# Patient Record
Sex: Male | Born: 1985 | Race: White | Hispanic: No | Marital: Married | Smoking: Former smoker
Health system: Southern US, Community
[De-identification: ages and names within clinical notes are randomized; demographics above are authoritative.]

## PROBLEM LIST (undated history)

## (undated) DIAGNOSIS — F411 Generalized anxiety disorder: Secondary | ICD-10-CM

## (undated) DIAGNOSIS — B019 Varicella without complication: Secondary | ICD-10-CM

## (undated) HISTORY — DX: Varicella without complication: B01.9

---

## 2008-02-20 ENCOUNTER — Ambulatory Visit (HOSPITAL_BASED_OUTPATIENT_CLINIC_OR_DEPARTMENT_OTHER): Admission: RE | Admit: 2008-02-20 | Discharge: 2008-02-20 | Payer: Self-pay | Admitting: Family Medicine

## 2008-02-20 ENCOUNTER — Ambulatory Visit: Payer: Self-pay | Admitting: Diagnostic Radiology

## 2011-07-27 ENCOUNTER — Emergency Department (HOSPITAL_COMMUNITY)
Admission: EM | Admit: 2011-07-27 | Discharge: 2011-07-27 | Disposition: A | Discharge status: Home / Self Care | Payer: BC Managed Care – PPO | Attending: Emergency Medicine | Admitting: Emergency Medicine

## 2011-07-27 ENCOUNTER — Encounter (HOSPITAL_COMMUNITY): Payer: Self-pay

## 2011-07-27 ENCOUNTER — Emergency Department (HOSPITAL_COMMUNITY): Payer: BC Managed Care – PPO

## 2011-07-27 DIAGNOSIS — R079 Chest pain, unspecified: Secondary | ICD-10-CM

## 2011-07-27 DIAGNOSIS — F17200 Nicotine dependence, unspecified, uncomplicated: Secondary | ICD-10-CM | POA: Insufficient documentation

## 2011-07-27 DIAGNOSIS — Z8249 Family history of ischemic heart disease and other diseases of the circulatory system: Secondary | ICD-10-CM | POA: Insufficient documentation

## 2011-07-27 LAB — BASIC METABOLIC PANEL
GFR calc non Af Amer: >90 mL/min (ref >90)
Glucose, Bld: 90 mg/dL (ref 70–99)
Potassium: 3.7 mEq/L (ref 3.5–5.1)
Sodium: 140 mEq/L (ref 135–145)

## 2011-07-27 LAB — CBC
Hemoglobin: 15.8 g/dL (ref 13.0–17.0)
MCHC: 36.3 g/dL — ABNORMAL HIGH (ref 30.0–36.0)
RBC: 5 MIL/uL (ref 4.22–5.81)

## 2011-07-27 NOTE — ED Provider Notes (Signed)
Medical screening examination/treatment/procedure(s) were conducted as a shared visit with non-physician practitioner(s) and myself.  I personally evaluated the patient during the encounter 26 yo man with several week Hx of left sided chest pain felt in the left anterior axillary line, worse in the past few days with tingling in left arm.  His exam shows him to appear robustly healthy, clear lungs, heart sounds normal. EKG and lab workup negative.  Called [facility] to obtain an echocardiogram on him.   Carleene Cooper III, MD 07/27/11 [phone]

## 2011-07-27 NOTE — ED Notes (Signed)
Patient transported to X-ray 

## 2011-07-27 NOTE — Discharge Instructions (Signed)
Chest Pain (Nonspecific) It is often hard to give a specific diagnosis for the cause of chest pain. There is always a chance that your pain could be related to something serious, such as a heart attack or a blood clot in the lungs. You need to follow up with your caregiver for further evaluation. CAUSES   Heartburn.   Pneumonia or bronchitis.   Anxiety or stress.   Inflammation around your heart (pericarditis) or lung (pleuritis or pleurisy).   A blood clot in the lung.   A collapsed lung (pneumothorax). It can develop suddenly on its own (spontaneous pneumothorax) or from injury (trauma) to the chest.   Shingles infection (herpes zoster virus).  The chest wall is composed of bones, muscles, and cartilage. Any of these can be the source of the pain.  The bones can be bruised by injury.   The muscles or cartilage can be strained by coughing or overwork.   The cartilage can be affected by inflammation and become sore (costochondritis).  DIAGNOSIS  Lab tests or other studies, such as X-rays, electrocardiography, stress testing, or cardiac imaging, may be needed to find the cause of your pain.  TREATMENT   Treatment depends on what may be causing your chest pain. Treatment may include:   Acid blockers for heartburn.   Anti-inflammatory medicine.   Pain medicine for inflammatory conditions.   Antibiotics if an infection is present.   You may be advised to change lifestyle habits. This includes stopping smoking and avoiding alcohol, caffeine, and chocolate.   You may be advised to keep your head raised (elevated) when sleeping. This reduces the chance of acid going backward from your stomach into your esophagus.   Most of the time, nonspecific chest pain will improve within 2 to 3 days with rest and mild pain medicine.  HOME CARE INSTRUCTIONS   If antibiotics were prescribed, take your antibiotics as directed. Finish them even if you start to feel better.   For the next few  days, avoid physical activities that bring on chest pain. Continue physical activities as directed.   Do not smoke.   Avoid drinking alcohol.   Only take over-the-counter or prescription medicine for pain, discomfort, or fever as directed by your caregiver.   Follow your caregiver's suggestions for further testing if your chest pain does not go away.   Keep any follow-up appointments you made. If you do not go to an appointment, you could develop lasting (chronic) problems with pain. If there is any problem keeping an appointment, you must call to reschedule.  SEEK MEDICAL CARE IF:   You think you are having problems from the medicine you are taking. Read your medicine instructions carefully.   Your chest pain does not go away, even after treatment.   You develop a rash with blisters on your chest.  SEEK IMMEDIATE MEDICAL CARE IF:   You have increased chest pain or pain that spreads to your arm, neck, jaw, back, or abdomen.   You develop shortness of breath, an increasing cough, or you are coughing up blood.   You have severe back or abdominal pain, feel nauseous, or vomit.   You develop severe weakness, fainting, or chills.   You have a fever.  THIS IS AN EMERGENCY. Do not wait to see if the pain will go away. Get medical help at once. Call your local emergency services (911 in [place] Do not drive yourself to the hospital. MAKE SURE YOU:   Understand these instructions.     Will watch your condition.   Will get help right away if you are not doing well or get worse.  Document Released: 10/29/2004 Document Revised: 01/08/2011 Document Reviewed: 08/25/2007 Exit[employer] Patient Information 2012 [employer]. 

## 2011-07-27 NOTE — ED Provider Notes (Signed)
History     CSN: [id number]  Arrival date & time 07/27/11  1348   First MD Initiated Contact with Patient 07/27/11 1408      Chief Complaint  Patient presents with  . Chest Pain    (Consider location/radiation/quality/duration/timing/severity/associated sxs/prior treatment) The history is provided by the patient.  Pt is a healthy 26 y/o M who presents to ED with c/c of gradually worsening left-sided CP over the last week. Rad to left arm with assoc tingling for the last 4 days. Pain intermittent, assoc with nausea and diaphoresis, no SOB. No recent illness, no fever. Sx worse with running, though only slightly. No alleviating factors. Took ASA PTA. RF include smoking and early FH of CAD in grandfather at 60 y/o. + FH DM, HLD, HTN. No prior cardiac evaluation. No personal or known family hx PE or DVT, no recent prolonged travel or LE swelling/pain.   History reviewed. No pertinent past medical history.  History reviewed. No pertinent past surgical history.  No family history on file.  History  Substance Use Topics  . Smoking status: Current Everyday Smoker  . Smokeless tobacco: Not on file  . Alcohol Use: No      Review of Systems 10 systems reviewed and are negative for acute change except as noted in the HPI.  Allergies  Review of patient's allergies indicates no known allergies.  Home Medications  No current outpatient prescriptions on file.  BP 133/70  Pulse 84  Temp 98.4 F (36.9 C) (Oral)  Resp 12  Ht 5\' 10"  (1.778 m)  Wt 170 lb (77.111 kg)  BMI 24.39 kg/m2  SpO2 98%  Physical Exam  Nursing note reviewed. Constitutional: He appears well-developed and well-nourished. No distress.       Vital signs are reviewed and are normal.   HENT:  Head: Normocephalic and atraumatic.  Right Ear: External ear normal.  Left Ear: External ear normal.       MMM  Eyes: Conjunctivae are normal. Pupils are equal, round, and reactive to light.  Neck: Neck supple.       No bruit heard  Cardiovascular: Normal rate, regular rhythm and normal heart sounds.  Exam reveals no friction rub.   No murmur heard.      Bilateral radial and DP pulses are 2+   Pulmonary/Chest: Effort normal and breath sounds normal. No respiratory distress. He has no wheezes. He exhibits no tenderness.  Abdominal: Soft. Bowel sounds are normal. He exhibits no distension. There is no tenderness.  Musculoskeletal: He exhibits no edema.       Calves supple and non-tender  Neurological: He is alert.       Speech clear. MAEW.  Skin: Skin is warm and dry. No rash noted. He is not diaphoretic.  Psychiatric: He has a normal mood and affect.    ED Course  Procedures (including critical care time)  Labs Reviewed  CBC - Abnormal; Notable for the following:    MCHC 36.3 (*)     All other components within normal limits  BASIC METABOLIC PANEL   Dg Chest 2 View  07/27/2011  *RADIOLOGY REPORT*  Clinical Data: Chest pain.  CHEST - 2 VIEW  Comparison: None  Findings: Heart and mediastinal contours are within normal limits. No focal opacities or effusions.  No acute bony abnormality.  IMPRESSION: No active cardiopulmonary disease.  Original Report Authenticated By: Cyndie Chime, M.D.     Dx 1: Chest pain   MDM  CP (with exertional  component) in healthy young male with only personal RF smoking, family hx early CAD in grandfather. EKG unremarkable, see attending MD note for interpretation. Troponin result not showing in system, 0.00. CBC and BMP unremarkable, CXR normal. Pt pain free at return of results. Has been evaluated by EDP, will consult [facility] to plan outpatient f/u.    4:28 PM EDP has spoken with Sherian Rein, NP with [facility], who advises that they will call pt tomorrow to schedule outpatient testing. Pt agrees with plan and will be d.c home, Pain free at this time.  Shaaron Adler, [place] 07/27/11 1630

## 2011-07-27 NOTE — ED Notes (Signed)
Pt. Reports having chest pain constant pressure behind his heart for over 1 week,  He describes it as someone is pushing on the back of his heart.  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  Upon arrival to our ED all of those symptoms were resolved but he continued to have chest pressure.  He reports that nothing increases the pain but when he moves his lt. Arm it decreases the pain `.  Pt. Also has a period of anxiety when he was younger, but is not being treated for that at present time.  He also denies any injuries in the past week.

## 2011-07-27 NOTE — ED Provider Notes (Signed)
2:36 PM  Date: 07/27/2011  Rate: 74  Rhythm: normal sinus rhythm  QRS Axis: normal  Intervals: normal  ST/T Wave abnormalities: normal  Conduction Disutrbances:none  Narrative Interpretation: Normal EKG  Old EKG Reviewed: none available    Carleene Cooper III, MD 07/27/11 1439

## 2014-08-03 ENCOUNTER — Telehealth: Payer: Self-pay

## 2014-08-03 NOTE — Telephone Encounter (Signed)
Pt sent message requesting to est w/ [place]. LM on vm for pt to cb  It was not a personalized answering meassgae, so did not leave any specific info.

## 2014-08-31 ENCOUNTER — Ambulatory Visit (INDEPENDENT_AMBULATORY_CARE_PROVIDER_SITE_OTHER): Payer: BC Managed Care – PPO | Admitting: Adult Health

## 2014-08-31 ENCOUNTER — Encounter: Payer: Self-pay | Admitting: Adult Health

## 2014-08-31 VITALS — BP 110/74 | Temp 98.0°F | Ht 70.0 in | Wt 173.5 lb

## 2014-08-31 DIAGNOSIS — Z0000 Encounter for general adult medical examination without abnormal findings: Secondary | ICD-10-CM

## 2014-08-31 NOTE — Progress Notes (Signed)
HPI:  Sergio Bray is here to establish care. Very pleasant and healthy 29 year old Caucasian male with no PMH.    Last PCP and physical: "one year ago" Immunizations:Tdap in last 10 years, unsure of when it is Diet:Heart healthy diet with portion control Exercise:30 minutes of cardio a day and weight training  Has the following chronic problems that require follow up and concerns today:  He has no issues that he would like addressed today.    ROS negative for unless reported above: fevers, chills,feeling poorly, unintentional weight loss, hearing or vision loss, chest pain, palpitations, leg claudication, struggling to breath,Not feeling congested in the chest, no orthopenia, no cough,no wheezing, normal appetite, no soft tissue swelling, no hemoptysis, melena, hematochezia, hematuria, falls, loc, si, or thoughts of self harm.    Past Medical History  Diagnosis Date  . Chicken pox     History reviewed. No pertinent past surgical history.  Family History  Problem Relation Age of Onset  . Elevated Lipids Mother     History   Social History  . Marital Status: Married    Spouse Name: N/A  . Number of Children: N/A  . Years of Education: N/A   Social History Main Topics  . Smoking status: Former Research scientist (life sciences)  . Smokeless tobacco: Not on file  . Alcohol Use: 0.0 oz/week    0 Standard drinks or equivalent per week     Comment: socially  . Drug Use: No  . Sexual Activity: Not on file   Other Topics Concern  . None   Social History Narrative    No current outpatient prescriptions on file.  EXAM:  Filed Vitals:   08/31/14 0946  BP: 110/74  Temp: 98 F (36.7 C)    Body mass index is 24.89 kg/(m^2).  GENERAL: vitals reviewed and listed above, alert, oriented, appears well hydrated and in no acute distress  HEENT: atraumatic, conjunttiva clear, no obvious abnormalities on inspection of external nose and ears. Moderate cerumen impaction in bilateral ears  NECK:  Neck is soft and supple without masses, no adenopathy or thyromegaly, trachea midline, no JVD. Normal range of motion.   LUNGS: clear to auscultation bilaterally, no wheezes, rales or rhonchi, good air movement  CV: Regular rate and rhythm, normal S1/S2, no audible murmurs, gallops, or rubs. No carotid bruit and no peripheral edema.   MS: moves all extremities without noticeable abnormality. No edema noted  Abd: soft/nontender/nondistended/normal bowel sounds   Skin: warm and dry, no rash   Extremities: No clubbing, cyanosis, or edema. Capillary refill is WNL. Pulses intact bilaterally in upper and lower extremities.   Neuro: CN II-XII intact, sensation and reflexes normal throughout, 5/5 muscle strength in bilateral upper and lower extremities. Normal finger to nose. Normal rapid alternating movements. Normal romberg. No pronator drift.   PSYCH: pleasant and cooperative, no obvious depression or anxiety  ASSESSMENT AND PLAN:  1. Routine general medical examination at a health care facility - Benign exam, will use this as a physical as well.  - Basic metabolic panel; Future - CBC with Differential/Platelet; Future - Hemoglobin A1c; Future - Hepatic function panel; Future - Lipid panel; Future - POCT urinalysis dipstick; Future - TSH; Future - Continue to eat healthy and exercise.  - Follow up in one year for CPE or sooner if needed.    -We reviewed the PMH, PSH, FH, SH, Meds and Allergies. -We provided refills for any medications we will prescribe as needed. -We addressed current concerns  per orders and patient instructions. -We have asked for records for pertinent exams, studies, vaccines and notes from previous providers. -We have advised patient to follow up per instructions below.   -Patient advised to return or notify a provider immediately if symptoms worsen or persist or new concerns arise.   Dorothyann Peng, AGNP

## 2014-08-31 NOTE — Patient Instructions (Signed)
It was great meeting you today!  Continue to eat healthy and exercise.   I will follow up with you regarding your blood work.   Come back in a year for a physical or sooner with any acute issue.

## 2014-12-04 ENCOUNTER — Encounter: Payer: Self-pay | Admitting: Adult Health

## 2014-12-04 ENCOUNTER — Ambulatory Visit (INDEPENDENT_AMBULATORY_CARE_PROVIDER_SITE_OTHER): Payer: BC Managed Care – PPO | Admitting: Adult Health

## 2014-12-04 VITALS — BP 118/80 | Temp 98.4°F | Ht 70.0 in | Wt 177.7 lb

## 2014-12-04 DIAGNOSIS — R202 Paresthesia of skin: Principal | ICD-10-CM

## 2014-12-04 DIAGNOSIS — R200 Anesthesia of skin: Secondary | ICD-10-CM

## 2014-12-04 DIAGNOSIS — F411 Generalized anxiety disorder: Secondary | ICD-10-CM | POA: Diagnosis not present

## 2014-12-04 MED ORDER — CITALOPRAM HYDROBROMIDE 20 MG PO TABS: 20.0000 mg | ORAL_TABLET | Freq: Every day | ORAL | Status: DC

## 2014-12-04 MED ORDER — LORAZEPAM 0.5 MG PO TABS: 0.5000 mg | ORAL_TABLET | Freq: Three times a day (TID) | ORAL | Status: DC

## 2014-12-04 NOTE — Progress Notes (Signed)
Pre visit review using our clinic review tool, if applicable. No additional management support is needed unless otherwise documented below in the visit note. 

## 2014-12-04 NOTE — Progress Notes (Signed)
Subjective:    Patient ID: Sergio Bray, male    DOB: 1985-09-25, 29 y.o.   MRN: [id number]  HPI  29 year old male who presents to the office today for numbness and tingling in the left arm. The pain is intermittent and has been going on for a week. The discomfort also spreads to the left axilla. He believes that this is caused by his anxiety. He has had the same symptoms in the past and they were related to his anxiety. Denies any crushing chest pain, pain that radiates up his jaw. He has a lot of stress and may be changing jobs and feels anxious about that.    Has tried Zoloft and Buspar in the past and endorsed doing fairly well on them.   Review of Systems  Constitutional: Negative.   Eyes: Negative for visual disturbance.  Respiratory: Negative.   Cardiovascular: Negative.   Musculoskeletal: Negative.   Neurological: Positive for numbness.  Psychiatric/Behavioral: Negative for suicidal ideas and sleep disturbance. The patient is nervous/anxious.   All other systems reviewed and are negative.  Past Medical History  Diagnosis Date  . Chicken pox     Social History   Social History  . Marital Status: Married    Spouse Name: N/A  . Number of Children: N/A  . Years of Education: N/A   Occupational History  . Not on file.   Social History Main Topics  . Smoking status: Former Research scientist (life sciences)  . Smokeless tobacco: Not on file  . Alcohol Use: 0.0 oz/week    0 Standard drinks or equivalent per week     Comment: socially  . Drug Use: No  . Sexual Activity: Not on file   Other Topics Concern  . 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   His wife is a Pharmacist, hospital as well.     No past surgical history on file.  Family History  Problem Relation Age of Onset  . Elevated Lipids Mother   . Diabetes Paternal Grandfather   . Heart attack Maternal Grandfather     No Known Allergies  No current outpatient prescriptions on file  prior to visit.   No current facility-administered medications on file prior to visit.    BP 118/80 mmHg  Temp(Src) 98.4 F (36.9 C) (Oral)  Ht 5\' 10"  (1.778 m)  Wt 177 lb 11.2 oz (80.604 kg)  BMI 25.50 kg/m2       Objective:   Physical Exam  Constitutional: He is oriented to person, place, and time. He appears well-developed and well-nourished. No distress.  Cardiovascular: Normal rate, regular rhythm, normal heart sounds and intact distal pulses.  Exam reveals no gallop and no friction rub.   No murmur heard. Pulmonary/Chest: Effort normal and breath sounds normal. No respiratory distress. He has no wheezes. He has no rales. He exhibits no tenderness.  Neurological: He is alert and oriented to person, place, and time.  Skin: Skin is warm and dry. No rash noted. He is not diaphoretic. No erythema. No pallor.  Psychiatric: He has a normal mood and affect. His behavior is normal. Judgment and thought content normal.  Nursing note and vitals reviewed.     Assessment & Plan:  1. Numbness and tingling in left arm - Likely from anxiety - EKG 12-Lead- Sinus Bradycardia, Rate 54  2. Generalized anxiety disorder - Reviewed non pharmaceutical therapies such as weight lifting, therapy, swimming,  yoga, deep breathing exercises.  - citalopram (CELEXA) 20 MG tablet; Take 1 tablet (20 mg total) by mouth daily.  Dispense: 30 tablet; Refill: 3 - LORazepam (ATIVAN) 0.5 MG tablet; Take 1 tablet (0.5 mg total) by mouth every 8 (eight) hours.  Dispense: 20 tablet; Refill: 0- To be used only if panic attack happens.

## 2014-12-04 NOTE — Patient Instructions (Addendum)
It was great seeing you again!  I have sent in a prescription for Celexa. Please take this every morning . As discussed this medication can take 4-6 weeks to reach therapeutic effect.   Work on Sport and exercise psychologist therapies as well. Follow up with me in 2 months to see how you are doing.   If you need anything, please let me know.

## 2014-12-14 ENCOUNTER — Other Ambulatory Visit: Payer: BC Managed Care – PPO

## 2015-02-05 ENCOUNTER — Ambulatory Visit: Payer: BC Managed Care – PPO | Admitting: Adult Health

## 2015-05-21 ENCOUNTER — Encounter: Payer: Self-pay | Admitting: Adult Health

## 2015-05-31 ENCOUNTER — Ambulatory Visit (INDEPENDENT_AMBULATORY_CARE_PROVIDER_SITE_OTHER): Payer: BC Managed Care – PPO | Admitting: Adult Health

## 2015-05-31 ENCOUNTER — Ambulatory Visit: Payer: BC Managed Care – PPO | Admitting: Adult Health

## 2015-05-31 ENCOUNTER — Encounter: Payer: Self-pay | Admitting: Adult Health

## 2015-05-31 VITALS — BP 110/70 | Temp 98.1°F | Ht 70.0 in | Wt 183.0 lb

## 2015-05-31 DIAGNOSIS — F411 Generalized anxiety disorder: Secondary | ICD-10-CM

## 2015-05-31 DIAGNOSIS — Z23 Encounter for immunization: Secondary | ICD-10-CM | POA: Diagnosis not present

## 2015-05-31 NOTE — Progress Notes (Signed)
   Subjective:    Patient ID: Sergio Bray, male    DOB: 1985/05/15, 30 y.o.   MRN: [id number]  HPI  30 year old male who presents to the office today for follow up for anxiety and he also needs to have a Tdap due to his wife having a baby in July.   He reports that he is not taking any medication for anxiety and is controlling it with diet and exercise. He feels "great" .    Review of Systems  Constitutional: Negative.   Respiratory: Negative.   Neurological: Negative.   Psychiatric/Behavioral: Negative.   All other systems reviewed and are negative.  Past Medical History  Diagnosis Date  . Chicken pox     Social History   Social History  . Marital Status: Married    Spouse Name: N/A  . Number of Children: N/A  . Years of Education: N/A   Occupational History  . Not on file.   Social History Main Topics  . Smoking status: Former Research scientist (life sciences)  . Smokeless tobacco: Not on file  . Alcohol Use: 0.0 oz/week    0 Standard drinks or equivalent per week     Comment: socially  . Drug Use: No  . Sexual Activity: Not on file   Other Topics Concern  . 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   His wife is a Pharmacist, hospital as well.     No past surgical history on file.  Family History  Problem Relation Age of Onset  . Elevated Lipids Mother   . Diabetes Paternal Grandfather   . Heart attack Maternal Grandfather     No Known Allergies  Current Outpatient Prescriptions on File Prior to Visit  Medication Sig Dispense Refill  . citalopram (CELEXA) 20 MG tablet Take 1 tablet (20 mg total) by mouth daily. (Patient not taking: Reported on 05/31/2015) 30 tablet 3  . LORazepam (ATIVAN) 0.5 MG tablet Take 1 tablet (0.5 mg total) by mouth every 8 (eight) hours. (Patient not taking: Reported on 05/31/2015) 20 tablet 0   No current facility-administered medications on file prior to visit.    BP 110/70 mmHg  Temp(Src) 98.1 F (36.7  C) (Oral)  Ht 5\' 10"  (1.778 m)  Wt 183 lb (83.008 kg)  BMI 26.26 kg/m2       Objective:   Physical Exam  Constitutional: He is oriented to person, place, and time. He appears well-developed and well-nourished. No distress.  Cardiovascular: Normal rate, regular rhythm, normal heart sounds and intact distal pulses.  Exam reveals no gallop.   No murmur heard. Pulmonary/Chest: Effort normal and breath sounds normal. No respiratory distress. He has no wheezes. He has no rales. He exhibits no tenderness.  Neurological: He is alert and oriented to person, place, and time.  Skin: Skin is warm and dry. No rash noted. He is not diaphoretic. No erythema. No pallor.  Psychiatric: He has a normal mood and affect. His behavior is normal. Judgment and thought content normal.  Nursing note and vitals reviewed.     Assessment & Plan:  1. Generalized anxiety disorder - Controlled for now without medication  - Follow up as needed 2. Need for Tdap vaccination  - Tdap vaccine greater than or equal to 7yo IM   * due for CPE in July   Dorothyann Peng, NP

## 2015-05-31 NOTE — Patient Instructions (Addendum)
It was great seeing you again.   Follow up with me in July for your physical   If you need anything in the meantime, please let me know.   Tommi Rumps, is a great name for a baby boy...Marland KitchenMarland Kitchen

## 2015-06-18 ENCOUNTER — Ambulatory Visit: Payer: BC Managed Care – PPO

## 2015-08-16 ENCOUNTER — Encounter: Payer: Self-pay | Admitting: Adult Health

## 2015-08-16 ENCOUNTER — Ambulatory Visit (INDEPENDENT_AMBULATORY_CARE_PROVIDER_SITE_OTHER): Payer: BC Managed Care – PPO | Admitting: Adult Health

## 2015-08-16 VITALS — BP 124/64 | Temp 97.7°F | Ht 70.0 in | Wt 178.0 lb

## 2015-08-16 DIAGNOSIS — Z0000 Encounter for general adult medical examination without abnormal findings: Secondary | ICD-10-CM

## 2015-08-16 LAB — POC URINALSYSI DIPSTICK (AUTOMATED)
BILIRUBIN UA: NEGATIVE
GLUCOSE UA: NEGATIVE
Ketones, UA: NEGATIVE
Leukocytes, UA: NEGATIVE
NITRITE UA: NEGATIVE
Protein, UA: NEGATIVE
RBC UA: NEGATIVE
Spec Grav, UA: 1.01
Urobilinogen, UA: 0.2
pH, UA: 7.5

## 2015-08-16 LAB — BASIC METABOLIC PANEL
BUN: 14 mg/dL (ref 6–23)
CO2: 25 meq/L (ref 19–32)
Calcium: 9.7 mg/dL (ref 8.4–10.5)
Chloride: 102 mEq/L (ref 96–112)
Creatinine, Ser: 0.98 mg/dL (ref 0.40–1.50)
GFR: 94.73 mL/min (ref >60.00)
GLUCOSE: 86 mg/dL (ref 70–99)
POTASSIUM: 4.1 meq/L (ref 3.5–5.1)
Sodium: 138 mEq/L (ref 135–145)

## 2015-08-16 LAB — LIPID PANEL
CHOLESTEROL: 150 mg/dL (ref 0–200)
HDL: 50.3 mg/dL (ref >39.00)
LDL Cholesterol: 85 mg/dL (ref 0–99)
NONHDL: 99.84
Total CHOL/HDL Ratio: 3
Triglycerides: 74 mg/dL (ref 0.0–149.0)
VLDL: 14.8 mg/dL (ref 0.0–40.0)

## 2015-08-16 LAB — HEPATIC FUNCTION PANEL
ALBUMIN: 4.8 g/dL (ref 3.5–5.2)
ALK PHOS: 43 U/L (ref 39–117)
ALT: 14 U/L (ref 0–53)
AST: 15 U/L (ref 0–37)
Bilirubin, Direct: 0.2 mg/dL (ref 0.0–0.3)
TOTAL PROTEIN: 7.4 g/dL (ref 6.0–8.3)
Total Bilirubin: 1 mg/dL (ref 0.2–1.2)

## 2015-08-16 LAB — CBC WITH DIFFERENTIAL/PLATELET
BASOS ABS: 0 10*3/uL (ref 0.0–0.1)
Basophils Relative: 0.4 % (ref 0.0–3.0)
EOS PCT: 1.5 % (ref 0.0–5.0)
Eosinophils Absolute: 0.1 10*3/uL (ref 0.0–0.7)
HEMATOCRIT: 46.2 % (ref 39.0–52.0)
HEMOGLOBIN: 15.8 g/dL (ref 13.0–17.0)
LYMPHS PCT: 26.3 % (ref 12.0–46.0)
Lymphs Abs: 1.1 10*3/uL (ref 0.7–4.0)
MCHC: 34.2 g/dL (ref 30.0–36.0)
MCV: 91.2 fl (ref 78.0–100.0)
MONOS PCT: 11.6 % (ref 3.0–12.0)
Monocytes Absolute: 0.5 10*3/uL (ref 0.1–1.0)
NEUTROS PCT: 60.2 % (ref 43.0–77.0)
Neutro Abs: 2.6 10*3/uL (ref 1.4–7.7)
Platelets: 179 10*3/uL (ref 150.0–400.0)
RBC: 5.06 Mil/uL (ref 4.22–5.81)
RDW: 13.3 % (ref 11.5–15.5)
WBC: 4.3 10*3/uL (ref 4.0–10.5)

## 2015-08-16 LAB — TSH: TSH: 0.79 u[IU]/mL (ref 0.35–4.50)

## 2015-08-16 NOTE — Progress Notes (Signed)
Subjective:    Patient ID: Chirstopher "ABE" Franchino, male    DOB: 10-Jul-1985, 30 y.o.   MRN: [id number]  HPI  Patient presents for yearly preventative medicine examination. He is avery healthy and active 30 year old who  has a past medical history of Chicken pox.   All immunizations and health maintenance protocols were reviewed with the patient and needed orders were placed.  Appropriate screening laboratory values were ordered for the patient including screening of hyperlipidemia, renal function and hepatic function. If indicated by BPH, a PSA was ordered.  Medication reconciliation,  past medical history, social history, problem list and allergies were reviewed in detail with the patient  Goals were established with regard to weight loss, exercise, and  diet in compliance with medications. He is exercising and eating healthy.   He reports that he is no longer taking medication for his anxiety and feels as though it is well controlled on his current medications.   He and his wife are expecting a baby in the next two weeks. He is very happy and excited for this   Review of Systems  Constitutional: Negative.   HENT: Negative.   Eyes: Negative.   Respiratory: Negative.   Cardiovascular: Negative.   Gastrointestinal: Negative.   Endocrine: Negative.   Genitourinary: Negative.   Musculoskeletal: Negative.   Skin: Negative.   Allergic/Immunologic: Negative.   Neurological: Negative.   Hematological: Negative.   Psychiatric/Behavioral: Negative.   All other systems reviewed and are negative.  Past Medical History  Diagnosis Date  . Chicken pox     Social History   Social History  . Marital Status: Married    Spouse Name: N/A  . Number of Children: N/A  . Years of Education: N/A   Occupational History  . Not on file.   Social History Main Topics  . Smoking status: Former Research scientist (life sciences)  . Smokeless tobacco: Not on file  . Alcohol Use: 0.0 oz/week    0 Standard drinks or  equivalent per week     Comment: socially  . Drug Use: No  . Sexual Activity: Not on file   Other Topics Concern  . 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   His wife is a Pharmacist, hospital as well.     No past surgical history on file.  Family History  Problem Relation Age of Onset  . Elevated Lipids Mother   . Diabetes Paternal Grandfather   . Heart attack Maternal Grandfather     No Known Allergies  Current Outpatient Prescriptions on File Prior to Visit  Medication Sig Dispense Refill  . citalopram (CELEXA) 20 MG tablet Take 1 tablet (20 mg total) by mouth daily. (Patient not taking: Reported on 05/31/2015) 30 tablet 3  . LORazepam (ATIVAN) 0.5 MG tablet Take 1 tablet (0.5 mg total) by mouth every 8 (eight) hours. (Patient not taking: Reported on 05/31/2015) 20 tablet 0   No current facility-administered medications on file prior to visit.    BP 124/64 mmHg  Temp(Src) 97.7 F (36.5 C) (Oral)  Ht 5\' 10"  (1.778 m)  Wt 178 lb (80.74 kg)  BMI 25.54 kg/m2       Objective:   Physical Exam  Constitutional: He is oriented to person, place, and time. He appears well-developed and well-nourished. No distress.  HENT:  Head: Normocephalic and atraumatic.  Right Ear: External ear normal.  Left Ear: External ear normal.  Mouth/Throat: Oropharynx is clear and moist. No oropharyngeal exudate.  Eyes: Conjunctivae and EOM are normal. Pupils are equal, round, and reactive to light. Right eye exhibits no discharge. Left eye exhibits no discharge. No scleral icterus.  Neck: Normal range of motion. Neck supple. No JVD present. No tracheal deviation present. No thyromegaly present.  Cardiovascular: Normal rate, normal heart sounds and intact distal pulses.  Exam reveals no gallop.   No murmur heard. Pulmonary/Chest: Effort normal and breath sounds normal. No stridor. No respiratory distress. He has no wheezes. He has no rales. He exhibits no  tenderness.  Abdominal: Soft. Bowel sounds are normal.  Musculoskeletal: Normal range of motion. He exhibits no edema or tenderness.  Lymphadenopathy:    He has no cervical adenopathy.  Neurological: He is alert and oriented to person, place, and time. He has normal reflexes. He displays normal reflexes. No cranial nerve deficit. He exhibits normal muscle tone. Coordination normal.  Skin: Skin is warm and dry. No rash noted. He is not diaphoretic. No erythema. No pallor.  Psychiatric: He has a normal mood and affect. His behavior is normal. Judgment and thought content normal.  Nursing note and vitals reviewed.     Assessment & Plan:  1. Routine general medical examination at a health care facility - Benign exam. Healthy and active young male - POCT Urinalysis Dipstick (Automated) - Basic metabolic panel - Hepatic function panel - Lipid panel - TSH - CBC with Differential/Platelet - Continue to exercise and eat healthy - Follow up in 1-2 yeas   Dorothyann Peng, NP

## 2015-08-16 NOTE — Patient Instructions (Addendum)
It was great seeing you again!  I will follow up with you regarding your blood work.   I will see you in a year or two for your next physical. Please let me know if you need anything in the meantime  Congrats on the baby and enjoy every aspect of it!   Health Maintenance, Male A healthy lifestyle and preventative care can promote health and wellness.  Maintain regular health, dental, and eye exams.  Eat a healthy diet. Foods like vegetables, fruits, whole grains, low-fat dairy products, and lean protein foods contain the nutrients you need and are low in calories. Decrease your intake of foods high in solid fats, added sugars, and salt. Get information about a proper diet from your health care provider, if necessary.  Regular physical exercise is one of the most important things you can do for your health. Most adults should get at least 150 minutes of moderate-intensity exercise (any activity that increases your heart rate and causes you to sweat) each week. In addition, most adults need muscle-strengthening exercises on 2 or more days a week.   Maintain a healthy weight. The body mass index (BMI) is a screening tool to identify possible weight problems. It provides an estimate of body fat based on height and weight. Your health care provider can find your BMI and can help you achieve or maintain a healthy weight. For males 20 years and older:  A BMI below 18.5 is considered underweight.  A BMI of 18.5 to 24.9 is normal.  A BMI of 25 to 29.9 is considered overweight.  A BMI of 30 and above is considered obese.  Maintain normal blood lipids and cholesterol by exercising and minimizing your intake of saturated fat. Eat a balanced diet with plenty of fruits and vegetables. Blood tests for lipids and cholesterol should begin at age 62 and be repeated every 5 years. If your lipid or cholesterol levels are high, you are over age 88, or you are at high risk for heart disease, you may need your  cholesterol levels checked more frequently.Ongoing high lipid and cholesterol levels should be treated with medicines if diet and exercise are not working.  If you smoke, find out from your health care provider how to quit. If you do not use tobacco, do not start.  Lung cancer screening is recommended for adults aged 42-80 years who are at high risk for developing lung cancer because of a history of smoking. A yearly low-dose CT scan of the lungs is recommended for people who have at least a 30-pack-year history of smoking and are current smokers or have quit within the past 15 years. A pack year of smoking is smoking an average of 1 pack of cigarettes a day for 1 year (for example, a 30-pack-year history of smoking could mean smoking 1 pack a day for 30 years or 2 packs a day for 15 years). Yearly screening should continue until the smoker has stopped smoking for at least 15 years. Yearly screening should be stopped for people who develop a health problem that would prevent them from having lung cancer treatment.  If you choose to drink alcohol, do not have more than 2 drinks per day. One drink is considered to be 12 oz (360 mL) of beer, 5 oz (150 mL) of wine, or 1.5 oz (45 mL) of liquor.  Avoid the use of street drugs. Do not share needles with anyone. Ask for help if you need support or instructions about stopping  the use of drugs.  High blood pressure causes heart disease and increases the risk of stroke. High blood pressure is more likely to develop in:  People who have blood pressure in the end of the normal range (100-139/85-89 mm Hg).  People who are overweight or obese.  People who are African American.  If you are 32-3 years of age, have your blood pressure checked every 3-5 years. If you are 75 years of age or older, have your blood pressure checked every year. You should have your blood pressure measured twice--once when you are at a hospital or clinic, and once when you are not at a  hospital or clinic. Record the average of the two measurements. To check your blood pressure when you are not at a hospital or clinic, you can use:  An automated blood pressure machine at a pharmacy.  A home blood pressure monitor.  If you are 36-51 years old, ask your health care provider if you should take aspirin to prevent heart disease.  Diabetes screening involves taking a blood sample to check your fasting blood sugar level. This should be done once every 3 years after age 41 if you are at a normal weight and without risk factors for diabetes. Testing should be considered at a younger age or be carried out more frequently if you are overweight and have at least 1 risk factor for diabetes.  Colorectal cancer can be detected and often prevented. Most routine colorectal cancer screening begins at the age of 79 and continues through age 39. However, your health care provider may recommend screening at an earlier age if you have risk factors for colon cancer. On a yearly basis, your health care provider may provide home test kits to check for hidden blood in the stool. A small camera at the end of a tube may be used to directly examine the colon (sigmoidoscopy or colonoscopy) to detect the earliest forms of colorectal cancer. Talk to your health care provider about this at age 49 when routine screening begins. A direct exam of the colon should be repeated every 5-10 years through age 28, unless early forms of precancerous polyps or small growths are found.  People who are at an increased risk for hepatitis B should be screened for this virus. You are considered at high risk for hepatitis B if:  You were born in a country where hepatitis B occurs often. Talk with your health care provider about which countries are considered high risk.  Your parents were born in a high-risk country and you have not received a shot to protect against hepatitis B (hepatitis B vaccine).  You have HIV or AIDS.  You  use needles to inject street drugs.  You live with, or have sex with, someone who has hepatitis B.  You are a man who has sex with other men (MSM).  You get hemodialysis treatment.  You take certain medicines for conditions like cancer, organ transplantation, and autoimmune conditions.  Hepatitis C blood testing is recommended for all people born from [birth year] through 1966 and any individual with known risk factors for hepatitis C.  Healthy men should no longer receive prostate-specific antigen (PSA) blood tests as part of routine cancer screening. Talk to your health care provider about prostate cancer screening.  Testicular cancer screening is not recommended for adolescents or adult males who have no symptoms. Screening includes self-exam, a health care provider exam, and other screening tests. Consult with your health care provider about any  symptoms you have or any concerns you have about testicular cancer.  Practice safe sex. Use condoms and avoid high-risk sexual practices to reduce the spread of sexually transmitted infections (STIs).  You should be screened for STIs, including gonorrhea and chlamydia if:  You are sexually active and are younger than 24 years.  You are older than 24 years, and your health care provider tells you that you are at risk for this type of infection.  Your sexual activity has changed since you were last screened, and you are at an increased risk for chlamydia or gonorrhea. Ask your health care provider if you are at risk.  If you are at risk of being infected with HIV, it is recommended that you take a prescription medicine daily to prevent HIV infection. This is called pre-exposure prophylaxis (PrEP). You are considered at risk if:  You are a man who has sex with other men (MSM).  You are a heterosexual man who is sexually active with multiple partners.  You take drugs by injection.  You are sexually active with a partner who has HIV.  Talk with  your health care provider about whether you are at high risk of being infected with HIV. If you choose to begin PrEP, you should first be tested for HIV. You should then be tested every 3 months for as long as you are taking PrEP.  Use sunscreen. Apply sunscreen liberally and repeatedly throughout the day. You should seek shade when your shadow is shorter than you. Protect yourself by wearing long sleeves, pants, a wide-brimmed hat, and sunglasses year round whenever you are outdoors.  Tell your health care provider of new moles or changes in moles, especially if there is a change in shape or color. Also, tell your health care provider if a mole is larger than the size of a pencil eraser.  A one-time screening for abdominal aortic aneurysm (AAA) and surgical repair of large AAAs by ultrasound is recommended for men aged 47-75 years who are current or former smokers.  Stay current with your vaccines (immunizations).   This information is not intended to replace advice given to you by your health care provider. Make sure you discuss any questions you have with your health care provider.   Document Released: 07/18/2007 Document Revised: 02/09/2014 Document Reviewed: 06/16/2010 Elsevier Interactive Patient Education [employer].

## 2016-08-02 ENCOUNTER — Encounter: Payer: Self-pay | Admitting: Adult Health

## 2016-08-03 ENCOUNTER — Ambulatory Visit (INDEPENDENT_AMBULATORY_CARE_PROVIDER_SITE_OTHER): Payer: BC Managed Care – PPO | Admitting: Family Medicine

## 2016-08-03 ENCOUNTER — Encounter: Payer: Self-pay | Admitting: Family Medicine

## 2016-08-03 VITALS — BP 112/74 | HR 68 | Temp 98.1°F | Wt 172.4 lb

## 2016-08-03 DIAGNOSIS — H6122 Impacted cerumen, left ear: Secondary | ICD-10-CM

## 2016-08-03 DIAGNOSIS — H60332 Swimmer's ear, left ear: Secondary | ICD-10-CM | POA: Diagnosis not present

## 2016-08-03 MED ORDER — CIPROFLOXACIN-HYDROCORTISONE 0.2-1 % OT SUSP: 3.0000 [drp] | Freq: Two times a day (BID) | OTIC | 0 refills | Status: DC

## 2016-08-03 NOTE — Patient Instructions (Addendum)
It was a pleasure to see you today! Please use medication as directed and follow up if symptoms do not improve.   Otitis Externa Otitis externa is an infection of the outer ear canal. The outer ear canal is the area between the outside of the ear and the eardrum. Otitis externa is sometimes called "swimmer's ear." What are the causes? This condition may be caused by:  Swimming in dirty water.  Moisture in the ear.  An injury to the inside of the ear.  An object stuck in the ear.  A cut or scrape on the outside of the ear.  What increases the risk? This condition is more likely to develop in swimmers. What are the signs or symptoms? The first symptom of this condition is often itching in the ear. Later signs and symptoms include:  Swelling of the ear.  Redness in the ear.  Ear pain. The pain may get worse when you pull on your ear.  Pus coming from the ear.  How is this diagnosed? This condition may be diagnosed by examining the ear and testing fluid from the ear for bacteria and funguses. How is this treated? This condition may be treated with:  Antibiotic ear drops. These are often given for 10-14 days.  Medicine to reduce itching and swelling.  Follow these instructions at home:  If you were prescribed antibiotic ear drops, apply them as told by your health care provider. Do not stop using the antibiotic even if your condition improves.  Take over-the-counter and prescription medicines only as told by your health care provider.  Keep all follow-up visits as told by your health care provider. This is important. How is this prevented?  Keep your ear dry. Use the corner of a towel to dry your ear after you swim or bathe.  Avoid scratching or putting things in your ear. Doing these things can damage the ear canal or remove the protective wax that lines it, which makes it easier for bacteria and funguses to grow.  Avoid swimming in lakes, polluted water, or pools  that may not have the right amount of chlorine.  Consider making ear drops and putting 3 or 4 drops in each ear after you swim. Ask your health care provider about how you can make ear drops. Contact a health care provider if:  You have a fever.  After 3 days your ear is still red, swollen, painful, or draining pus.  Your redness, swelling, or pain gets worse.  You have a severe headache.  You have redness, swelling, pain, or tenderness in the area behind your ear. This information is not intended to replace advice given to you by your health care provider. Make sure you discuss any questions you have with your health care provider. Document Released: 01/19/2005 Document Revised: 02/26/2015 Document Reviewed: 10/29/2014 Elsevier Interactive Patient Education  [employer].

## 2016-08-03 NOTE — Progress Notes (Signed)
Subjective:    Patient ID: Sergio Bray, male    DOB: 1985-11-24, 31 y.o.   MRN: [id number]  HPI  Sergio Bray is a 31 year old male who presents today with left ear pressure and pain that has been present for 3 days following a recent vacation and swimming in a pool.  He reports that he felt as if he had "water in his ear." Mildly diminished hearing is associated. Pain is noted as mild and described as pressure.  He denies fever, chills, sweats, drainage from ear, nasal congestions, post nasal drip, rhinitis, and sinus pressure/pain. No treatment has been tried at home. Aggravating factor noted with swimming. No alleviating factors noted. He is a former smoker  Review of Systems  Constitutional: Negative for chills, fatigue and fever.  HENT: Positive for ear pain. Negative for congestion, postnasal drip, rhinorrhea, sinus pain, sinus pressure and sneezing.   Respiratory: Negative for cough, shortness of breath and wheezing.   Cardiovascular: Negative for chest pain.  Gastrointestinal: Negative for diarrhea, nausea and vomiting.  Skin: Negative for rash.  Neurological: Negative for dizziness and headaches.   Past Medical History:  Diagnosis Date  . Chicken pox      Social History   Social History  . Marital status: Married    Spouse name: N/A  . Number of children: N/A  . Years of education: N/A   Occupational History  . Not on file.   Social History Main Topics  . Smoking status: Former Research scientist (life sciences)  . Smokeless tobacco: Former Systems developer  . Alcohol use 0.0 oz/week     Comment: socially  . Drug use: No  . Sexual activity: Not on file   Other Topics Concern  . 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   His wife is a Pharmacist, hospital as well.     No past surgical history on file.  Family History  Problem Relation Age of Onset  . Elevated Lipids Mother   . Diabetes Paternal Grandfather   . Heart attack Maternal Grandfather      No Known Allergies  Current Outpatient Prescriptions on File Prior to Visit  Medication Sig Dispense Refill  . citalopram (CELEXA) 20 MG tablet Take 1 tablet (20 mg total) by mouth daily. 30 tablet 3  . LORazepam (ATIVAN) 0.5 MG tablet Take 1 tablet (0.5 mg total) by mouth every 8 (eight) hours. 20 tablet 0   No current facility-administered medications on file prior to visit.     BP 112/74 (BP Location: Left Arm, Patient Position: Sitting, Cuff Size: Normal)   Pulse 68   Temp 98.1 F (36.7 C) (Oral)   Wt 172 lb 6.4 oz (78.2 kg)   SpO2 98%   BMI 24.74 kg/m       Objective:   Physical Exam  Constitutional: He is oriented to person, place, and time. He appears well-developed and well-nourished.  HENT:  Right Ear: Tympanic membrane normal.  Nose: No rhinorrhea. Right sinus exhibits no maxillary sinus tenderness and no frontal sinus tenderness. Left sinus exhibits no maxillary sinus tenderness and no frontal sinus tenderness.  Mouth/Throat: Oropharynx is clear and moist and mucous membranes are normal. No oropharyngeal exudate or posterior oropharyngeal erythema.  Left ear findings reveal: Erythematous and mildly edematous canal ; cerumen impaction noted that obscured visualization of Left TM. Cerumen removed with irrigation and Left TM was normal.  Mild tenderness with manipulation of  left auricle  Eyes: Pupils are equal, round, and reactive to light. No scleral icterus.  Neck: Neck supple.  Cardiovascular: Normal rate and regular rhythm.   Pulmonary/Chest: Effort normal and breath sounds normal. He has no wheezes. He has no rales.  Lymphadenopathy:    He has no cervical adenopathy.  Neurological: He is alert and oriented to person, place, and time.  Skin: Skin is warm and dry.  Psychiatric: He has a normal mood and affect. His behavior is normal. Judgment and thought content normal.       Assessment & Plan:  1. Acute swimmer's ear of left side Exam and history support  treatment with otic suspension; Advised follow up if symptoms do not improve with treatment in 3 days, worsen, or fever ?100 develops.  Also reviewed the importance of follow up if any pain or tenderness is noted behind ear. - ciprofloxacin-hydrocortisone (CIPRO HC) OTIC suspension; Place 3 drops into the left ear 2 (two) times daily.  Dispense: 10 mL; Refill: 0  2. Impacted cerumen of left ear Cerumen removed with irrigation; able to visual left TM after removal. - EAR CERUMEN REMOVAL  Follow up as needed.  Delano Metz, FNP-C

## 2016-08-07 ENCOUNTER — Encounter: Payer: Self-pay | Admitting: Adult Health

## 2016-10-23 ENCOUNTER — Encounter: Payer: Self-pay | Admitting: Adult Health

## 2017-01-15 ENCOUNTER — Encounter: Payer: Self-pay | Admitting: Adult Health

## 2017-01-18 ENCOUNTER — Ambulatory Visit: Payer: Self-pay | Admitting: Adult Health

## 2017-01-18 ENCOUNTER — Encounter: Payer: Self-pay | Admitting: Adult Health

## 2017-01-20 ENCOUNTER — Ambulatory Visit: Payer: Self-pay | Admitting: Adult Health

## 2017-01-20 ENCOUNTER — Encounter: Payer: Self-pay | Admitting: Adult Health

## 2017-01-20 ENCOUNTER — Ambulatory Visit: Payer: BC Managed Care – PPO | Admitting: Adult Health

## 2017-01-20 ENCOUNTER — Ambulatory Visit (INDEPENDENT_AMBULATORY_CARE_PROVIDER_SITE_OTHER)
Admission: RE | Admit: 2017-01-20 | Discharge: 2017-01-20 | Disposition: A | Discharge status: Home / Self Care | Payer: BC Managed Care – PPO | Source: Ambulatory Visit | Attending: Adult Health | Admitting: Adult Health

## 2017-01-20 VITALS — BP 116/68 | Temp 98.3°F | Wt 180.0 lb

## 2017-01-20 DIAGNOSIS — R1032 Left lower quadrant pain: Secondary | ICD-10-CM

## 2017-01-20 LAB — POC URINALSYSI DIPSTICK (AUTOMATED)
Bilirubin, UA: NEGATIVE
GLUCOSE UA: NEGATIVE
Ketones, UA: NEGATIVE
Leukocytes, UA: NEGATIVE
NITRITE UA: NEGATIVE
PH UA: 6.5 (ref 5.0–8.0)
PROTEIN UA: NEGATIVE
RBC UA: NEGATIVE
Spec Grav, UA: 1.02 (ref 1.010–1.025)
UROBILINOGEN UA: 0.2 U/dL

## 2017-01-20 LAB — CBC WITH DIFFERENTIAL/PLATELET
BASOS ABS: 0 10*3/uL (ref 0.0–0.1)
BASOS PCT: 0.6 % (ref 0.0–3.0)
EOS ABS: 0 10*3/uL (ref 0.0–0.7)
Eosinophils Relative: 0.8 % (ref 0.0–5.0)
HEMATOCRIT: 46.6 % (ref 39.0–52.0)
HEMOGLOBIN: 15.9 g/dL (ref 13.0–17.0)
LYMPHS PCT: 24.5 % (ref 12.0–46.0)
Lymphs Abs: 1.2 10*3/uL (ref 0.7–4.0)
MCHC: 34 g/dL (ref 30.0–36.0)
MCV: 91.1 fl (ref 78.0–100.0)
Monocytes Absolute: 0.4 10*3/uL (ref 0.1–1.0)
Monocytes Relative: 8.2 % (ref 3.0–12.0)
Neutro Abs: 3.3 10*3/uL (ref 1.4–7.7)
Neutrophils Relative %: 65.9 % (ref 43.0–77.0)
Platelets: 191 10*3/uL (ref 150.0–400.0)
RBC: 5.12 Mil/uL (ref 4.22–5.81)
RDW: 12.6 % (ref 11.5–15.5)
WBC: 5 10*3/uL (ref 4.0–10.5)

## 2017-01-20 NOTE — Progress Notes (Signed)
Subjective:    Patient ID: Sergio Bray, male    DOB: [birth year], 31 y.o.   MRN: [id number]  Abdominal Pain  This is a new problem. The current episode started in the past 7 days. The onset quality is sudden. The problem occurs constantly. The problem has been gradually improving. The pain is located in the LUQ and LLQ. The pain is at a severity of 3/10. The pain is mild. The quality of the pain is dull. Associated symptoms include nausea (resolved ). Pertinent negatives include no constipation, diarrhea, fever, frequency, hematuria or vomiting. Exacerbated by: sleeping on left side  The pain is relieved by nothing. He has tried acetaminophen for the symptoms. The treatment provided no relief. There is no history of abdominal surgery, gallstones, GERD, irritable bowel syndrome, pancreatitis or ulcerative colitis.    Review of Systems  Constitutional: Negative for fever.  Respiratory: Negative.   Cardiovascular: Negative.   Gastrointestinal: Positive for abdominal pain and nausea (resolved ). Negative for abdominal distention, blood in stool, constipation, diarrhea, rectal pain and vomiting.  Genitourinary: Negative for frequency and hematuria.   Past Medical History:  Diagnosis Date  . Chicken pox     Social History   Socioeconomic History  . Marital status: Married    Spouse name: Not on file  . Number of children: Not on file  . Years of education: Not on file  . Highest education level: Not on file  Social Needs  . Financial resource strain: Not on file  . Food insecurity - worry: Not on file  . Food insecurity - inability: Not on file  . Transportation needs - medical: Not on file  . Transportation needs - non-medical: Not on file  Occupational History  . Not on file  Tobacco Use  . Smoking status: Former Research scientist (life sciences)  . Smokeless tobacco: Former Network engineer and Sexual Activity  . Alcohol use: Yes    Alcohol/week: 0.0 oz    Comment: socially  . Drug use: No  .  Sexual activity: Not on file  Other Topics Concern  . 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   His wife is a Pharmacist, hospital as well.     History reviewed. No pertinent surgical history.  Family History  Problem Relation Age of Onset  . Elevated Lipids Mother   . Diabetes Paternal Grandfather   . Heart attack Maternal Grandfather     No Known Allergies  No current outpatient medications on file prior to visit.   No current facility-administered medications on file prior to visit.     BP 116/68 (BP Location: Left Arm)   Temp 98.3 F (36.8 C) (Oral)   Wt 180 lb (81.6 kg)   BMI 25.83 kg/m        Objective:   Physical Exam  Constitutional: He is oriented to person, place, and time. He appears well-developed and well-nourished. No distress.  Cardiovascular: Normal rate, regular rhythm, normal heart sounds and intact distal pulses. Exam reveals no gallop and no friction rub.  No murmur heard. Pulmonary/Chest: Effort normal and breath sounds normal. No respiratory distress. He has no wheezes. He has no rales. He exhibits no tenderness.  Abdominal: Soft. Bowel sounds are normal. He exhibits no distension and no mass. There is tenderness in the left lower quadrant. There is no rigidity, no rebound and no guarding.  Neurological: He is alert and oriented to person, place,  and time.  Skin: Skin is warm and dry. No rash noted. He is not diaphoretic. No erythema. No pallor.  Psychiatric: He has a normal mood and affect. His behavior is normal. Judgment and thought content normal.  Nursing note and vitals reviewed.     Assessment & Plan:  1. LLQ pain - No signs of IBS/IBD, no hernia felt, doubt diverticulitis. Possible kidney stone or MSK in nature.  - POCT Urinalysis Dipstick (Automated) - CBC with Differential/Platelet - DG Abd 1 View; Future - Consider CT   Dorothyann Peng, NP

## 2017-01-21 MED ORDER — TRAMADOL HCL 50 MG PO TABS: 50.0000 mg | ORAL_TABLET | Freq: Four times a day (QID) | ORAL | 0 refills | Status: DC | PRN

## 2017-01-21 MED ORDER — TAMSULOSIN HCL 0.4 MG PO CAPS: 0.4000 mg | ORAL_CAPSULE | Freq: Every day | ORAL | 0 refills | Status: DC

## 2017-01-21 NOTE — Telephone Encounter (Signed)
Spoke to patient and informed him of his x ray. Appears to have kidney stone. Reports pain is a little worse today and he can feel the pain farther down in his groin.   Will send in Flomax and Tramadol to take if needed  Advised fluids

## 2017-03-27 ENCOUNTER — Encounter: Payer: Self-pay | Admitting: Adult Health

## 2017-04-02 ENCOUNTER — Ambulatory Visit: Payer: BC Managed Care – PPO | Admitting: Adult Health

## 2017-04-02 ENCOUNTER — Encounter: Payer: Self-pay | Admitting: Adult Health

## 2017-04-02 VITALS — BP 100/66 | Temp 98.2°F | Wt 177.0 lb

## 2017-04-02 DIAGNOSIS — R1032 Left lower quadrant pain: Secondary | ICD-10-CM | POA: Diagnosis not present

## 2017-04-02 LAB — BASIC METABOLIC PANEL
BUN: 17 mg/dL (ref 6–23)
CHLORIDE: 103 meq/L (ref 96–112)
CO2: 31 mEq/L (ref 19–32)
Calcium: 10.2 mg/dL (ref 8.4–10.5)
Creatinine, Ser: 1.04 mg/dL (ref 0.40–1.50)
GFR: 87.54 mL/min (ref >60.00)
Glucose, Bld: 82 mg/dL (ref 70–99)
Potassium: 4 mEq/L (ref 3.5–5.1)
Sodium: 140 mEq/L (ref 135–145)

## 2017-04-02 NOTE — Progress Notes (Signed)
Subjective:    Patient ID: Sergio Bray, male    DOB: December 28, 1985, 32 y.o.   MRN: [id number]  HPI  32 year old male who  has a past medical history of Chicken pox. He presents to the office today for continued LLQ pain. I last saw him for this in December this issue and ordered an x-ray, which showed bilateral pelvic calcifications, possible phlebolithis cannot rule out kidney stone.  Was prescribed Flomax for approximately 1 month in the hopes that he would pass a kidney stone.  He denies passing any stones and has since ran out of Flomax. He reports that he continues to have intermittent episodes of"nagging"pain in his left lower quadrant.  He will goal multiple weeks without having pain and then have pain for a couple weeks.  Pain is 3-4 out of 10 in quality.  He denies any problems with urination, issues with bowel, nausea, vomiting, or fevers.  He does not feel acutely ill   Review of Systems See HPI   Past Medical History:  Diagnosis Date  . Chicken pox     Social History   Socioeconomic History  . Marital status: Married    Spouse name: Not on file  . Number of children: Not on file  . Years of education: Not on file  . Highest education level: Not on file  Social Needs  . Financial resource strain: Not on file  . Food insecurity - worry: Not on file  . Food insecurity - inability: Not on file  . Transportation needs - medical: Not on file  . Transportation needs - non-medical: Not on file  Occupational History  . Not on file  Tobacco Use  . Smoking status: Former Research scientist (life sciences)  . Smokeless tobacco: Former Network engineer and Sexual Activity  . Alcohol use: Yes    Alcohol/week: 0.0 oz    Comment: socially  . Drug use: No  . Sexual activity: Not on file  Other Topics Concern  . 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   His wife is a Pharmacist, hospital as well.     History reviewed. No pertinent surgical history.  Family  History  Problem Relation Age of Onset  . Elevated Lipids Mother   . Diabetes Paternal Grandfather   . Heart attack Maternal Grandfather     No Known Allergies  No current outpatient medications on file prior to visit.   No current facility-administered medications on file prior to visit.     BP 100/66 (BP Location: Left Arm)   Temp 98.2 F (36.8 C) (Oral)   Wt 177 lb (80.3 kg)   BMI 25.40 kg/m       Objective:   Physical Exam  Constitutional: He is oriented to person, place, and time. He appears well-developed and well-nourished. No distress.  Cardiovascular: Normal rate, regular rhythm, normal heart sounds and intact distal pulses. Exam reveals no gallop and no friction rub.  No murmur heard. Pulmonary/Chest: Effort normal and breath sounds normal. No respiratory distress. He has no wheezes. He has no rales. He exhibits no tenderness.  Abdominal: Soft. Bowel sounds are normal. He exhibits no distension and no mass. There is no hepatosplenomegaly, splenomegaly or hepatomegaly. There is tenderness in the left lower quadrant. There is no rebound, no guarding and no CVA tenderness. No hernia.  Musculoskeletal: Normal range of motion. He exhibits no edema, tenderness or deformity.  Neurological: He  is alert and oriented to person, place, and time.  Skin: Skin is warm and dry. No rash noted. He is not diaphoretic. No erythema. No pallor.  Psychiatric: He has a normal mood and affect. His behavior is normal. Judgment and thought content normal.  Nursing note and vitals reviewed.     Assessment & Plan:  1. LLQ pain -Cannot rule out kidney stone, possible diverticulitis but unlikely.  We will get CAT scan of abdomen and pelvis.  Consider referral to GI - Basic Metabolic Panel - CT Abdomen Pelvis W Contrast; Future  Dorothyann Peng, NP

## 2017-04-08 ENCOUNTER — Inpatient Hospital Stay: Admission: RE | Admit: 2017-04-08 | Payer: BC Managed Care – PPO | Source: Ambulatory Visit

## 2017-04-13 ENCOUNTER — Other Ambulatory Visit: Payer: Self-pay | Admitting: Adult Health

## 2017-04-13 ENCOUNTER — Ambulatory Visit (INDEPENDENT_AMBULATORY_CARE_PROVIDER_SITE_OTHER)
Admission: RE | Admit: 2017-04-13 | Discharge: 2017-04-13 | Disposition: A | Discharge status: Home / Self Care | Payer: BC Managed Care – PPO | Source: Ambulatory Visit | Attending: Adult Health | Admitting: Adult Health

## 2017-04-13 DIAGNOSIS — R1032 Left lower quadrant pain: Secondary | ICD-10-CM | POA: Diagnosis not present

## 2017-04-13 MED ORDER — IOPAMIDOL (ISOVUE-300) INJECTION 61%
100.0000 mL | Freq: Once | INTRAVENOUS | Status: AC | PRN
  Administered 2017-04-13: 100 mL via INTRAVENOUS

## 2017-08-13 ENCOUNTER — Encounter: Payer: Self-pay | Admitting: Adult Health

## 2017-08-17 ENCOUNTER — Ambulatory Visit: Payer: BC Managed Care – PPO | Admitting: Adult Health

## 2018-02-08 ENCOUNTER — Encounter: Payer: Self-pay | Admitting: Adult Health

## 2018-02-08 ENCOUNTER — Ambulatory Visit: Payer: BC Managed Care – PPO | Admitting: Adult Health

## 2018-02-08 VITALS — BP 124/80 | Temp 98.2°F | Wt 180.0 lb

## 2018-02-08 DIAGNOSIS — F419 Anxiety disorder, unspecified: Secondary | ICD-10-CM

## 2018-02-08 MED ORDER — LORAZEPAM 0.5 MG PO TABS: ORAL_TABLET | ORAL | 0 refills | Status: DC

## 2018-02-08 MED ORDER — CITALOPRAM HYDROBROMIDE 10 MG PO TABS: 10.0000 mg | ORAL_TABLET | Freq: Every day | ORAL | 1 refills | Status: DC

## 2018-02-08 NOTE — Progress Notes (Signed)
Subjective:    Patient ID: Sergio Bray, male    DOB: 1985-05-28, 33 y.o.   MRN: [id number]  HPI 33 year old male who  has a past medical history of Chicken pox.   Presents to the office today for anxiety.  He suffered from anxiety intermittently since the seventh grade.  He was medicated at this time with BuSpar.  I last saw him for this back in 2016 and he was prescribed Celexa 20 mg daily and Ativan for as needed use.  He reports that he never took this medication but it was reassuring that he had it on hand.  Over the last for 5 months he has had increased anxiety in certain situations where he feels like he is going to pass out.  Anxiety is most notable while driving, especially in the left hand lane and when in a crowded environment.  Feels as though the anxiety has been increasing over the last for 5 months  Review of Systems See HPI   Past Medical History:  Diagnosis Date  . Chicken pox     Social History   Socioeconomic History  . Marital status: Married    Spouse name: Not on file  . Number of children: Not on file  . Years of education: Not on file  . Highest education level: Not on file  Occupational History  . Not on file  Social Needs  . Financial resource strain: Not on file  . Food insecurity:    Worry: Not on file    Inability: Not on file  . Transportation needs:    Medical: Not on file    Non-medical: Not on file  Tobacco Use  . Smoking status: Former Research scientist (life sciences)  . Smokeless tobacco: Former Network engineer and Sexual Activity  . Alcohol use: Yes    Alcohol/week: 0.0 standard drinks    Comment: socially  . Drug use: No  . Sexual activity: Not on file  Lifestyle  . Physical activity:    Days per week: Not on file    Minutes per session: Not on file  . Stress: Not on file  Relationships  . Social connections:    Talks on phone: Not on file    Gets together: Not on file    Attends religious service: Not on file    Active member of club or  organization: Not on file    Attends meetings of clubs or organizations: Not on file    Relationship status: Not on file  . Intimate partner violence:    Fear of current or ex partner: Not on file    Emotionally abused: Not on file    Physically abused: Not on file    Forced sexual activity: Not on file  Other Topics Concern  . 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   His wife is a Pharmacist, hospital as well.     History reviewed. No pertinent surgical history.  Family History  Problem Relation Age of Onset  . Elevated Lipids Mother   . Diabetes Paternal Grandfather   . Heart attack Maternal Grandfather     No Known Allergies  No current outpatient medications on file prior to visit.   No current facility-administered medications on file prior to visit.     BP 124/80   Temp 98.2 F (36.8 C)   Wt 180 lb (81.6 kg)   BMI 25.83 kg/m  Objective:   Physical Exam Vitals signs and nursing note reviewed.  Constitutional:      Appearance: Normal appearance.  Cardiovascular:     Rate and Rhythm: Normal rate and regular rhythm.     Pulses: Normal pulses.     Heart sounds: Normal heart sounds.  Pulmonary:     Effort: Pulmonary effort is normal.     Breath sounds: Normal breath sounds.  Skin:    General: Skin is warm and dry.  Neurological:     General: No focal deficit present.     Mental Status: He is oriented to person, place, and time.  Psychiatric:        Mood and Affect: Mood normal.        Behavior: Behavior normal.        Thought Content: Thought content normal.        Judgment: Judgment normal.       Assessment & Plan:

## 2018-03-05 ENCOUNTER — Encounter: Payer: Self-pay | Admitting: Adult Health

## 2018-03-07 ENCOUNTER — Encounter: Payer: Self-pay | Admitting: Adult Health

## 2018-04-01 ENCOUNTER — Other Ambulatory Visit: Payer: Self-pay | Admitting: Adult Health

## 2018-04-01 DIAGNOSIS — F419 Anxiety disorder, unspecified: Secondary | ICD-10-CM

## 2018-04-01 NOTE — Telephone Encounter (Signed)
Cory, assessment and plan is missing from the last visit.  Not sure when pt should return.

## 2018-04-01 NOTE — Telephone Encounter (Signed)
Sent to the pharmacy by e-scribe. 

## 2018-04-01 NOTE — Telephone Encounter (Signed)
Ok to refill for 90 +1 

## 2018-05-24 IMAGING — CT CT ABD-PELV W/ CM
2 of 7 series · 14 of 46 positions shown, 18 images · IV contrast (ISOVUE 300)
Comparison: 01/20/2017 plain film exam.  No comparison CT.

CLINICAL DATA: 31-year-old male with left lower quadrant tenderness
for 8-9 weeks. Subsequent encounter.

EXAM:
CT ABDOMEN AND PELVIS WITH CONTRAST
TECHNIQUE: Multidetector CT imaging of the abdomen and pelvis was performed
using the standard protocol following bolus administration of
intravenous contrast.
CONTRAST:  100mL YD8GOT-ZGG IOPAMIDOL (YD8GOT-ZGG) INJECTION 61%

[Series 2: abd/pel w · axial · 0.73mm/px · z∈[-462,-77]mm · 11 of 92 slices shown, 15 images]
[im 10/92  soft-tissue]
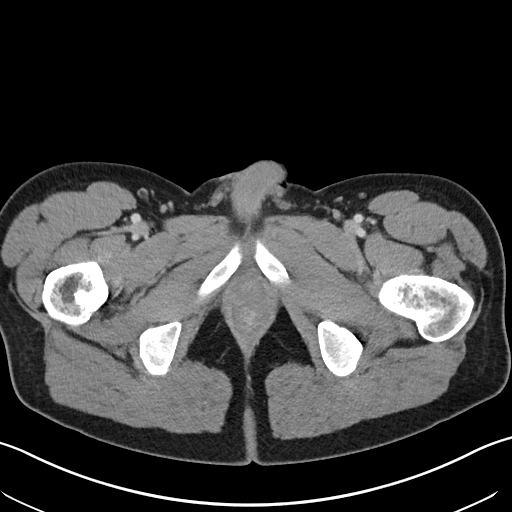
[im 10/92  bone]
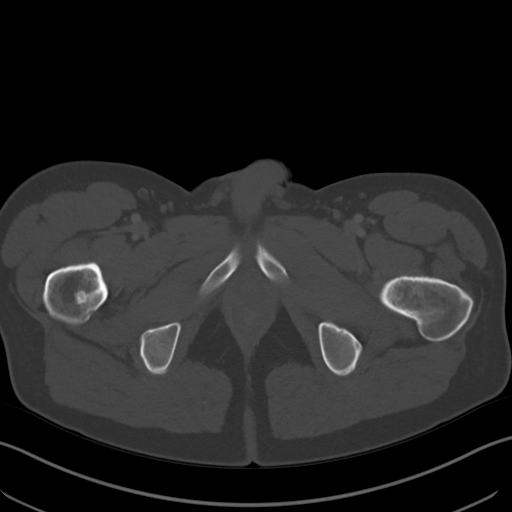
[im 20/92  soft-tissue]
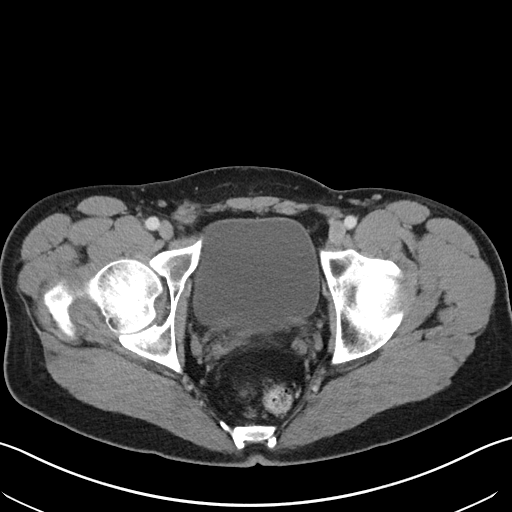
[im 29/92  soft-tissue]
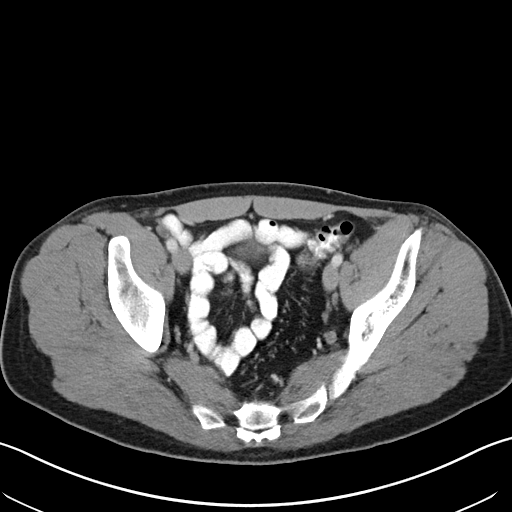
[im 39/92  soft-tissue]
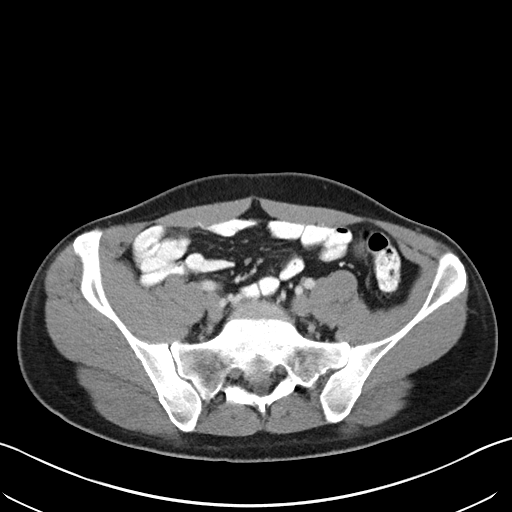
[im 48/92  soft-tissue]
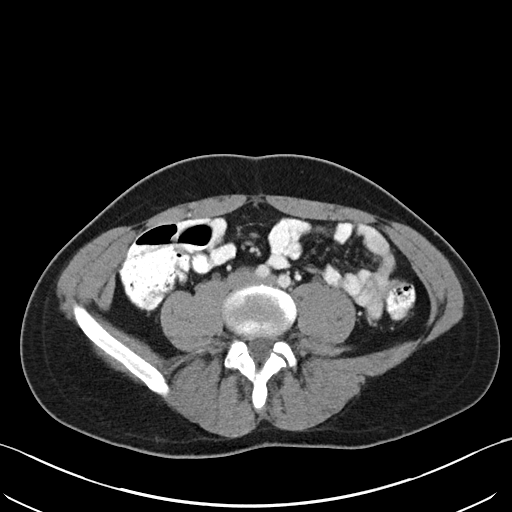
[im 58/92  soft-tissue]
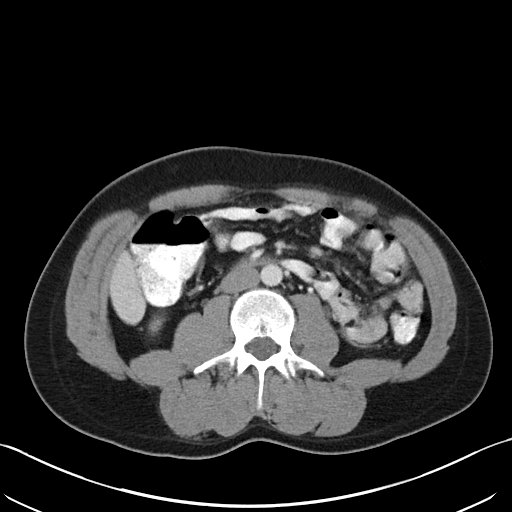
[im 68/92  soft-tissue]
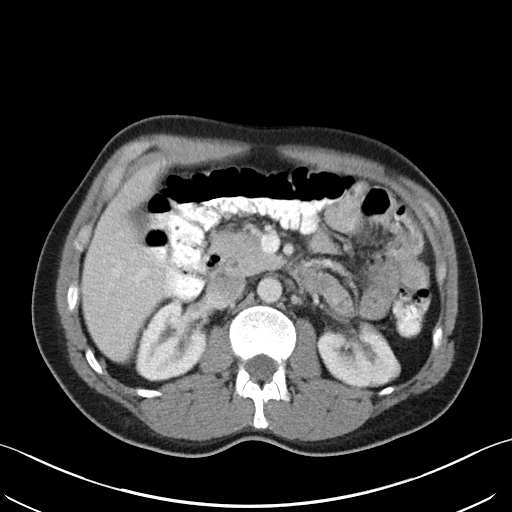
[im 72/92  lung]
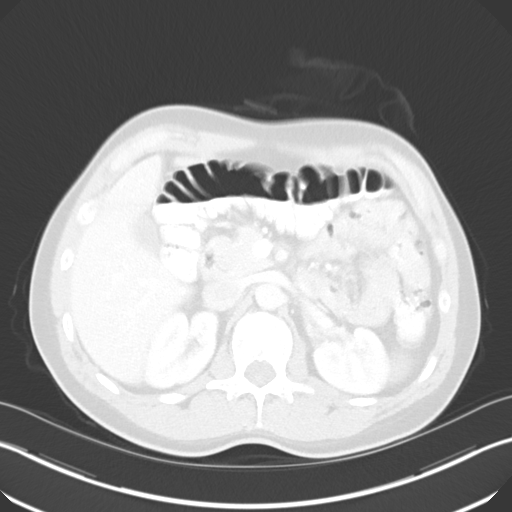
[im 77/92  soft-tissue]
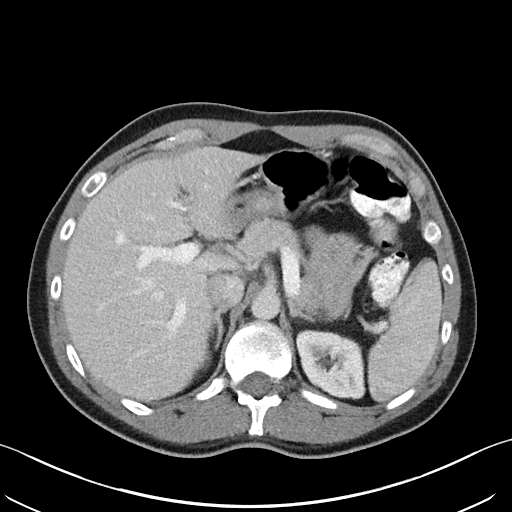
[im 77/92  lung]
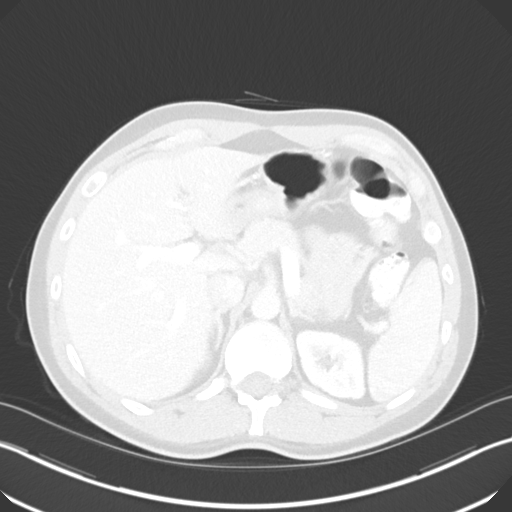
[im 82/92  lung]
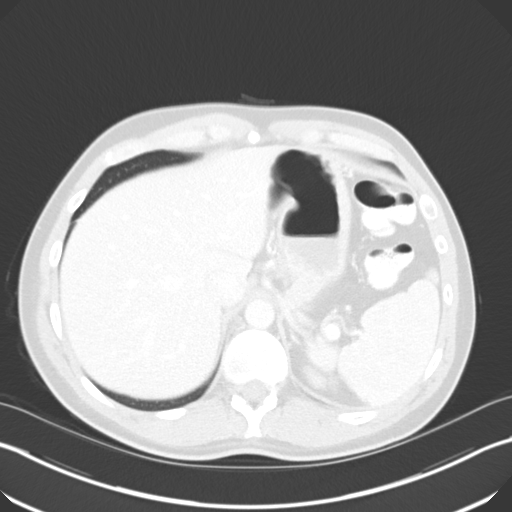
[im 87/92  soft-tissue]
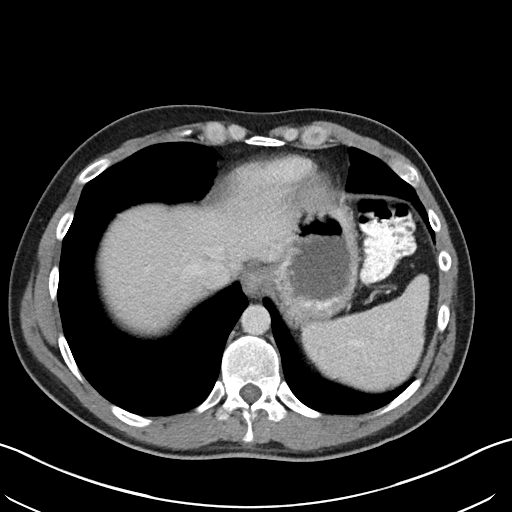
[im 87/92  lung]
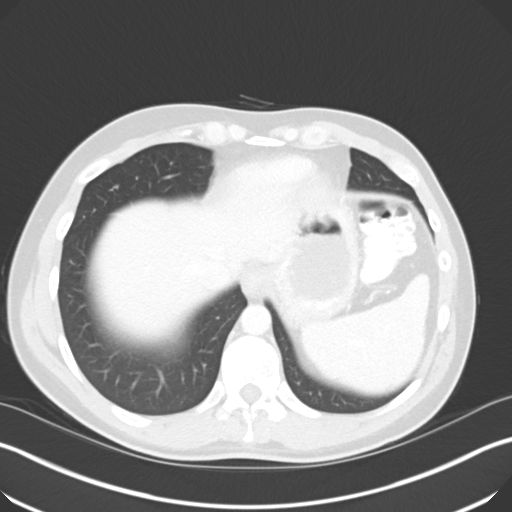
[im 87/92  bone]
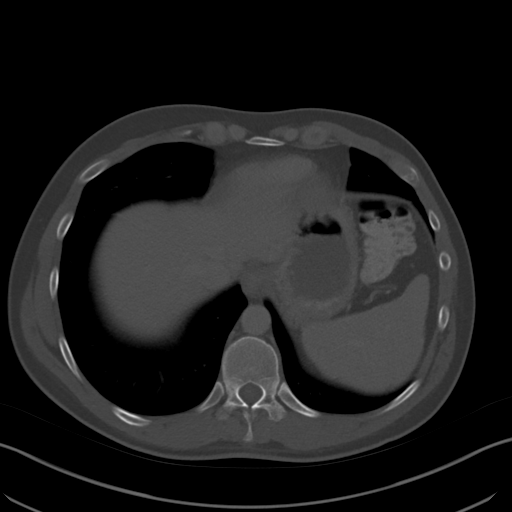

[Series 5: abd/pel w st · coronal · 0.72mm/px · 3 of 91 slices shown]
[im 23/91  soft-tissue]
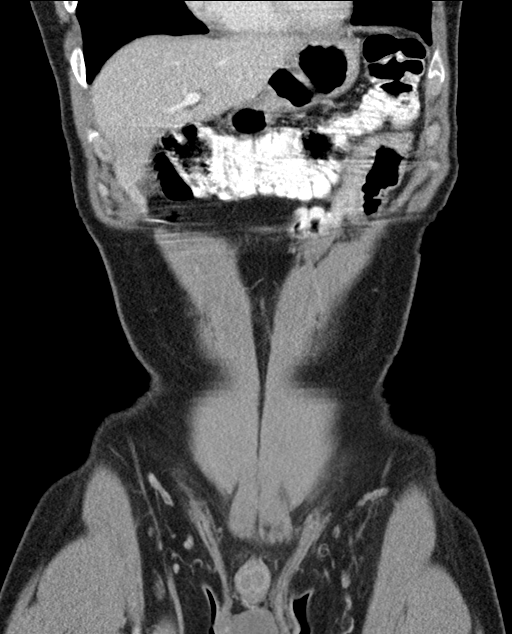
[im 46/91  soft-tissue]
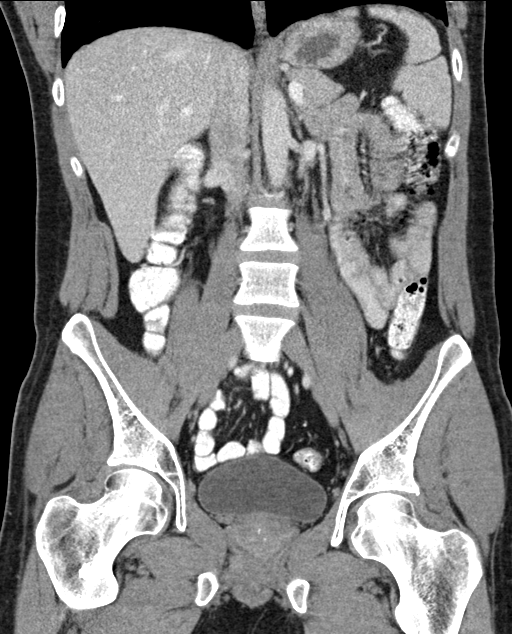
[im 68/91  soft-tissue]
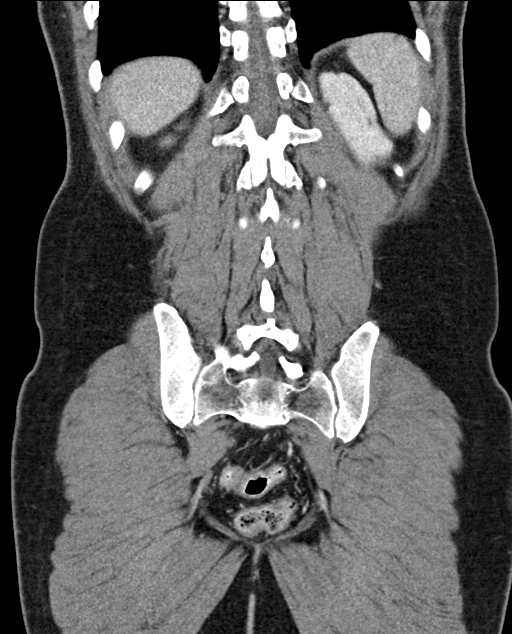

[14 of 46 positions shown; findings below may reference images not displayed]

FINDINGS: Lower chest: Lung bases clear.  Heart size within normal limits.

Hepatobiliary: No worrisome hepatic lesion or calcified gallstone.

Pancreas: No worrisome pancreatic mass or inflammation.

Spleen: No splenic mass or enlargement.

Adrenals/Urinary Tract: Duplicated left renal collecting system. No
obstructing stone. No worrisome renal or adrenal lesion. Noncontrast
filled views of the urinary bladder unremarkable.

Stomach/Bowel: Under distended sigmoid colon. No findings to suggest
diverticulitis. No extraluminal bowel inflammatory process or
obvious mass identified. No gastric or small bowel abnormality
noted.

Vascular/Lymphatic: No aortic aneurysm or large vessel occlusion. No
adenopathy.

Reproductive: Small prostatic gland calcifications.

Other: No free air or bowel containing hernia.

Musculoskeletal: L5 bilateral pars defects with 4 mm anterior slip
L5. Minimal curvature lumbar spine. Well-defined subcentimeter
sclerotic focus right intertrochanteric region probably incidental
finding. Tiny bone island right femoral head.
IMPRESSION: Exam motion degraded.

Under distended sigmoid colon. No findings to suggest
diverticulitis.

Incidentally noted is duplicated nonobstructing left renal
collecting system.

Bilateral L5 pars defects with 4 mm anterior slip L5.

## 2018-06-05 ENCOUNTER — Encounter: Payer: Self-pay | Admitting: Adult Health

## 2018-06-18 ENCOUNTER — Encounter: Payer: Self-pay | Admitting: Adult Health

## 2018-08-17 ENCOUNTER — Encounter: Payer: Self-pay | Admitting: Adult Health

## 2018-08-17 DIAGNOSIS — D229 Melanocytic nevi, unspecified: Secondary | ICD-10-CM

## 2018-08-17 NOTE — Telephone Encounter (Signed)
Ok to put in referral?  

## 2018-08-31 ENCOUNTER — Encounter: Payer: Self-pay | Admitting: Adult Health

## 2018-09-27 ENCOUNTER — Encounter: Payer: Self-pay | Admitting: Adult Health

## 2018-09-27 ENCOUNTER — Other Ambulatory Visit: Payer: Self-pay

## 2018-09-27 ENCOUNTER — Ambulatory Visit (INDEPENDENT_AMBULATORY_CARE_PROVIDER_SITE_OTHER): Payer: BC Managed Care – PPO | Admitting: Adult Health

## 2018-09-27 VITALS — BP 112/72 | Temp 98.1°F | Ht 72.25 in | Wt 181.0 lb

## 2018-09-27 DIAGNOSIS — F419 Anxiety disorder, unspecified: Secondary | ICD-10-CM

## 2018-09-27 DIAGNOSIS — Z0000 Encounter for general adult medical examination without abnormal findings: Secondary | ICD-10-CM

## 2018-09-27 LAB — COMPREHENSIVE METABOLIC PANEL
ALT: 18 U/L (ref 0–53)
AST: 13 U/L (ref 0–37)
Albumin: 4.9 g/dL (ref 3.5–5.2)
Alkaline Phosphatase: 48 U/L (ref 39–117)
BUN: 19 mg/dL (ref 6–23)
CO2: 30 mEq/L (ref 19–32)
Calcium: 9.5 mg/dL (ref 8.4–10.5)
Chloride: 103 mEq/L (ref 96–112)
Creatinine, Ser: 0.88 mg/dL (ref 0.40–1.50)
GFR: 98.98 mL/min (ref >60.00)
Glucose, Bld: 77 mg/dL (ref 70–99)
Potassium: 4.2 mEq/L (ref 3.5–5.1)
Sodium: 140 mEq/L (ref 135–145)
Total Bilirubin: 0.8 mg/dL (ref 0.2–1.2)
Total Protein: 6.9 g/dL (ref 6.0–8.3)

## 2018-09-27 LAB — CBC WITH DIFFERENTIAL/PLATELET
Basophils Absolute: 0 10*3/uL (ref 0.0–0.1)
Basophils Relative: 0.3 % (ref 0.0–3.0)
Eosinophils Absolute: 0 10*3/uL (ref 0.0–0.7)
Eosinophils Relative: 0.9 % (ref 0.0–5.0)
HCT: 46.2 % (ref 39.0–52.0)
Hemoglobin: 16 g/dL (ref 13.0–17.0)
Lymphocytes Relative: 20 % (ref 12.0–46.0)
Lymphs Abs: 1 10*3/uL (ref 0.7–4.0)
MCHC: 34.7 g/dL (ref 30.0–36.0)
MCV: 91.4 fl (ref 78.0–100.0)
Monocytes Absolute: 0.5 10*3/uL (ref 0.1–1.0)
Monocytes Relative: 10.6 % (ref 3.0–12.0)
Neutro Abs: 3.3 10*3/uL (ref 1.4–7.7)
Neutrophils Relative %: 68.2 % (ref 43.0–77.0)
Platelets: 180 10*3/uL (ref 150.0–400.0)
RBC: 5.06 Mil/uL (ref 4.22–5.81)
RDW: 13.3 % (ref 11.5–15.5)
WBC: 4.8 10*3/uL (ref 4.0–10.5)

## 2018-09-27 LAB — LIPID PANEL
Cholesterol: 159 mg/dL (ref 0–200)
HDL: 54.3 mg/dL (ref >39.00)
LDL Cholesterol: 83 mg/dL (ref 0–99)
NonHDL: 105.11
Total CHOL/HDL Ratio: 3
Triglycerides: 111 mg/dL (ref 0.0–149.0)
VLDL: 22.2 mg/dL (ref 0.0–40.0)

## 2018-09-27 LAB — TSH: TSH: 0.89 u[IU]/mL (ref 0.35–4.50)

## 2018-09-27 NOTE — Progress Notes (Signed)
Subjective:    Patient ID: Sergio Bray, male    DOB: 1985-05-18, 33 y.o.   MRN: [id number]  HPI Patient presents for yearly preventative medicine examination. He is a pleasant 33 year old male who  has a past medical history of Chicken pox.  Anxiety - Currently prescribed Celexa 10 mg. He feels well controlled. Has not had to take Ativan anytime recently.   All immunizations and health maintenance protocols were reviewed with the patient and needed orders were placed. Refused influenza - will get at work  Appropriate screening laboratory values were ordered for the patient including screening of hyperlipidemia, renal function and hepatic function.  Medication reconciliation,  past medical history, social history, problem list and allergies were reviewed in detail with the patient  Goals were established with regard to weight loss, exercise, and  diet in compliance with medications  Wt Readings from Last 3 Encounters:  09/27/18 181 lb (82.1 kg)  02/08/18 180 lb (81.6 kg)  04/02/17 177 lb (80.3 kg)    Review of Systems  Constitutional: Negative.   HENT: Negative.   Eyes: Negative.   Respiratory: Negative.   Cardiovascular: Negative.   Gastrointestinal: Negative.   Endocrine: Negative.   Genitourinary: Negative.   Musculoskeletal: Negative.   Skin: Negative.   Allergic/Immunologic: Negative.   Neurological: Negative.   Hematological: Negative.   Psychiatric/Behavioral: Negative.   All other systems reviewed and are negative.  Past Medical History:  Diagnosis Date  . Chicken pox     Social History   Socioeconomic History  . Marital status: Married    Spouse name: Not on file  . Number of children: Not on file  . Years of education: Not on file  . Highest education level: Not on file  Occupational History  . Not on file  Social Needs  . Financial resource strain: Not on file  . Food insecurity    Worry: Not on file    Inability: Not on file  .  Transportation needs    Medical: Not on file    Non-medical: Not on file  Tobacco Use  . Smoking status: Former Research scientist (life sciences)  . Smokeless tobacco: Former Network engineer and Sexual Activity  . Alcohol use: Yes    Alcohol/week: 0.0 standard drinks    Comment: socially  . Drug use: No  . Sexual activity: Not on file  Lifestyle  . Physical activity    Days per week: Not on file    Minutes per session: Not on file  . Stress: Not on file  Relationships  . Social Herbalist on phone: Not on file    Gets together: Not on file    Attends religious service: Not on file    Active member of club or organization: Not on file    Attends meetings of clubs or organizations: Not on file    Relationship status: Not on file  . Intimate partner violence    Fear of current or ex partner: Not on file    Emotionally abused: Not on file    Physically abused: Not on file    Forced sexual activity: Not on file  Other Topics Concern  . 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   His wife is a Pharmacist, hospital as well.     History reviewed. No pertinent surgical history.  Family History  Problem Relation Age of Onset  .  Elevated Lipids Mother   . Diabetes Paternal Grandfather   . Heart attack Maternal Grandfather     No Known Allergies  Current Outpatient Medications on File Prior to Visit  Medication Sig Dispense Refill  . citalopram (CELEXA) 10 MG tablet TAKE 1 TABLET BY MOUTH EVERY DAY 90 tablet 1  . LORazepam (ATIVAN) 0.5 MG tablet Take 0.5-1 tab every 8 hours PRN 30 tablet 0   No current facility-administered medications on file prior to visit.     BP 112/72   Temp 98.1 F (36.7 C)   Ht 6' 0.25" (1.835 m)   Wt 181 lb (82.1 kg)   BMI 24.38 kg/m       Objective:   Physical Exam Vitals signs and nursing note reviewed.  Constitutional:      General: He is not in acute distress.    Appearance: He is not diaphoretic.  HENT:      Head: Normocephalic and atraumatic.     Right Ear: Tympanic membrane, ear canal and external ear normal. There is no impacted cerumen.     Left Ear: Tympanic membrane, ear canal and external ear normal. There is no impacted cerumen.     Nose: Nose normal. No congestion or rhinorrhea.     Mouth/Throat:     Mouth: Mucous membranes are moist.     Pharynx: Oropharynx is clear. No oropharyngeal exudate.  Eyes:     General: No scleral icterus.       Right eye: No discharge.        Left eye: No discharge.     Conjunctiva/sclera: Conjunctivae normal.     Pupils: Pupils are equal, round, and reactive to light.  Neck:     Musculoskeletal: Normal range of motion and neck supple.     Thyroid: No thyromegaly.     Vascular: No JVD.     Trachea: No tracheal deviation.  Cardiovascular:     Rate and Rhythm: Normal rate and regular rhythm.     Pulses: Normal pulses.     Heart sounds: Normal heart sounds. No murmur. No friction rub. No gallop.   Pulmonary:     Effort: Pulmonary effort is normal. No respiratory distress.     Breath sounds: Normal breath sounds. No stridor. No wheezing, rhonchi or rales.  Chest:     Chest wall: No tenderness.  Abdominal:     General: Bowel sounds are normal. There is no distension.     Palpations: Abdomen is soft. There is no mass.     Tenderness: There is no abdominal tenderness. There is no right CVA tenderness, left CVA tenderness, guarding or rebound.     Hernia: No hernia is present.  Musculoskeletal: Normal range of motion.        General: No swelling, tenderness, deformity or signs of injury.     Right lower leg: No edema.     Left lower leg: No edema.  Lymphadenopathy:     Cervical: No cervical adenopathy.  Skin:    General: Skin is warm and dry.     Coloration: Skin is not jaundiced or pale.     Findings: No bruising, erythema, lesion or rash.  Neurological:     General: No focal deficit present.     Mental Status: He is alert and oriented to person,  place, and time.     Cranial Nerves: No cranial nerve deficit.     Sensory: No sensory deficit.     Motor: No weakness or abnormal muscle  tone.     Coordination: Coordination normal.     Gait: Gait normal.     Deep Tendon Reflexes: Reflexes are normal and symmetric. Reflexes normal.  Psychiatric:        Mood and Affect: Mood normal.        Thought Content: Thought content normal.        Judgment: Judgment normal.       Assessment & Plan:  1. Routine general medical examination at a health care facility - Benign exam  - Continue to exercise and eat healthy - Follow up in one year or sooner if needed - Follow up in one year or sooner if needed - CBC with Differential/Platelet - Comprehensive metabolic panel - Lipid panel - TSH  2. Anxiety - Continue Celexa 10 mg daily   Dorothyann Peng, NP

## 2018-10-06 ENCOUNTER — Other Ambulatory Visit: Payer: Self-pay | Admitting: Adult Health

## 2018-10-06 DIAGNOSIS — F419 Anxiety disorder, unspecified: Secondary | ICD-10-CM

## 2018-10-06 NOTE — Telephone Encounter (Signed)
Sent to the pharmacy by e-scribe. 

## 2019-01-20 ENCOUNTER — Encounter: Payer: Self-pay | Admitting: Adult Health

## 2019-01-20 ENCOUNTER — Other Ambulatory Visit: Payer: Self-pay | Admitting: Adult Health

## 2019-03-15 ENCOUNTER — Encounter: Payer: Self-pay | Admitting: Adult Health

## 2019-05-29 ENCOUNTER — Encounter: Payer: Self-pay | Admitting: Adult Health

## 2019-07-29 ENCOUNTER — Encounter: Payer: Self-pay | Admitting: Adult Health

## 2019-08-10 ENCOUNTER — Other Ambulatory Visit: Payer: Self-pay | Admitting: Adult Health

## 2019-08-10 DIAGNOSIS — F419 Anxiety disorder, unspecified: Secondary | ICD-10-CM

## 2019-08-10 MED ORDER — LORAZEPAM 0.5 MG PO TABS: ORAL_TABLET | ORAL | 0 refills | Status: DC

## 2019-08-10 MED ORDER — CITALOPRAM HYDROBROMIDE 10 MG PO TABS: 10.0000 mg | ORAL_TABLET | Freq: Every day | ORAL | 1 refills | Status: DC

## 2019-08-22 ENCOUNTER — Encounter: Payer: Self-pay | Admitting: Adult Health

## 2019-11-13 ENCOUNTER — Encounter: Payer: Self-pay | Admitting: Adult Health

## 2019-11-14 ENCOUNTER — Other Ambulatory Visit: Payer: Self-pay

## 2019-11-14 ENCOUNTER — Ambulatory Visit: Payer: BC Managed Care – PPO | Admitting: Family Medicine

## 2019-11-14 VITALS — BP 110/84 | HR 72 | Temp 97.8°F | Ht 72.0 in | Wt 183.4 lb

## 2019-11-14 DIAGNOSIS — R42 Dizziness and giddiness: Secondary | ICD-10-CM

## 2019-11-14 NOTE — Progress Notes (Signed)
Established Patient Office Visit  Subjective:  Patient ID: Sergio Bray, male    DOB: Aug 09, 1985  Age: 34 y.o. MRN: [id number]  CC:  Chief Complaint  Patient presents with  . Dizziness    not sure of onset, gets dizzy with random movement. Going on for 3-4 day now. Only med is citalopram    HPI Sergio Bray presents for dizziness for the past 3 to 4 days.  He initially described this as some lightheadedness with movement but apparently there may be some vertigo component.  Generally very healthy.  His only medication is low-dose Celexa which she has been on for some time.  He denies any fever, significant nasal congestion, headaches, focal weakness, confusion or any other focal neurologic symptoms.  He had Covid infection back in July after he had been vaccinated.  No other post Covid type symptoms.  No recent hearing loss.  No ataxia type symptoms.  Symptoms have been relatively mild.  No recent acute sinusitis symptoms.  He does not describe any consistent orthostatic symptoms.  He does have some sense that changing position seems to trigger the dizziness slightly.  Does not have as much dizziness when sitting still at rest  Past Medical History:  Diagnosis Date  . Chicken pox     No past surgical history on file.  Family History  Problem Relation Age of Onset  . Elevated Lipids Mother   . Diabetes Paternal Grandfather   . Heart attack Maternal Grandfather   . Prostate cancer Father 39            Social History   Socioeconomic History  . Marital status: Married    Spouse name: Not on file  . Number of children: Not on file  . Years of education: Not on file  . Highest education level: Not on file  Occupational History  . Not on file  Tobacco Use  . Smoking status: Former Research scientist (life sciences)  . Smokeless tobacco: Former Network engineer and Sexual Activity  . Alcohol use: Yes    Alcohol/week: 0.0 standard drinks    Comment: socially  . Drug use: No  . Sexual  activity: Not on file  Other Topics Concern  . 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   His wife is a Pharmacist, hospital as well.    Social Determinants of Health   Financial Resource Strain:   . Difficulty of Paying Living Expenses: Not on file  Food Insecurity:   . Worried About Charity fundraiser in the Last Year: Not on file  . Ran Out of Food in the Last Year: Not on file  Transportation Needs:   . Lack of Transportation (Medical): Not on file  . Lack of Transportation (Non-Medical): Not on file  Physical Activity:   . Days of Exercise per Week: Not on file  . Minutes of Exercise per Session: Not on file  Stress:   . Feeling of Stress : Not on file  Social Connections:   . Frequency of Communication with Friends and Family: Not on file  . Frequency of Social Gatherings with Friends and Family: Not on file  . Attends Religious Services: Not on file  . Active Member of Clubs or Organizations: Not on file  . Attends Archivist Meetings: Not on file  . Marital Status: Not on file  Intimate Partner Violence:   . Fear of Current or  Ex-Partner: Not on file  . Emotionally Abused: Not on file  . Physically Abused: Not on file  . Sexually Abused: Not on file    Outpatient Medications Prior to Visit  Medication Sig Dispense Refill  . citalopram (CELEXA) 10 MG tablet Take 1 tablet (10 mg total) by mouth daily. 90 tablet 1  . LORazepam (ATIVAN) 0.5 MG tablet Take 0.5-1 tab every 8 hours PRN 30 tablet 0   No facility-administered medications prior to visit.    No Known Allergies  ROS Review of Systems  Constitutional: Negative for chills, fever and unexpected weight change.  Respiratory: Negative for cough and shortness of breath.   Cardiovascular: Negative for chest pain.  Neurological: Positive for dizziness. Negative for seizures, syncope, speech difficulty, weakness and headaches.  Psychiatric/Behavioral: Negative for  confusion.      Objective:    Physical Exam Vitals reviewed.  Constitutional:      Appearance: Normal appearance.  HENT:     Head:     Comments: Does have some cerumen in both canals Cardiovascular:     Rate and Rhythm: Normal rate and regular rhythm.     Heart sounds: No murmur heard.   Pulmonary:     Effort: Pulmonary effort is normal.     Breath sounds: Normal breath sounds.  Musculoskeletal:     Cervical back: Neck supple.     Right lower leg: No edema.     Left lower leg: No edema.  Neurological:     General: No focal deficit present.     Mental Status: He is alert and oriented to person, place, and time.     Cranial Nerves: No cranial nerve deficit.     Motor: No weakness.     Coordination: Coordination normal.     Gait: Gait normal.     Comments: He did have some mild vertigo type symptoms when lying supine with head turned to the right.  Symptoms were very transient only lasting a few seconds  No obvious nystagmus.  No ataxia.  No focal weakness.     BP 110/84   Pulse 72   Temp 97.8 F (36.6 C)   Ht 6' (1.829 m)   Wt 183 lb 6.4 oz (83.2 kg)   SpO2 99%   BMI 24.87 kg/m  Wt Readings from Last 3 Encounters:  11/14/19 183 lb 6.4 oz (83.2 kg)  09/27/18 181 lb (82.1 kg)  02/08/18 180 lb (81.6 kg)     Health Maintenance Due  Topic Date Due  . INFLUENZA VACCINE  Never done    There are no preventive care reminders to display for this patient.  Lab Results  Component Value Date   TSH 0.89 09/27/2018   Lab Results  Component Value Date   WBC 4.8 09/27/2018   HGB 16.0 09/27/2018   HCT 46.2 09/27/2018   MCV 91.4 09/27/2018   PLT 180.0 09/27/2018   Lab Results  Component Value Date   NA 140 09/27/2018   K 4.2 09/27/2018   CO2 30 09/27/2018   GLUCOSE 77 09/27/2018   BUN 19 09/27/2018   CREATININE 0.88 09/27/2018   BILITOT 0.8 09/27/2018   ALKPHOS 48 09/27/2018   AST 13 09/27/2018   ALT 18 09/27/2018   PROT 6.9 09/27/2018   ALBUMIN 4.9  09/27/2018   CALCIUM 9.5 09/27/2018   GFR 98.98 09/27/2018   Lab Results  Component Value Date   CHOL 159 09/27/2018   Lab Results  Component Value Date  HDL 54.30 09/27/2018   Lab Results  Component Value Date   LDLCALC 83 09/27/2018   Lab Results  Component Value Date   TRIG 111.0 09/27/2018   Lab Results  Component Value Date   CHOLHDL 3 09/27/2018   No results found for: HGBA1C    Assessment & Plan:   Problem List Items Addressed This Visit    None    Visit Diagnoses    Dizziness    -  Primary   Relevant Orders   Orthostatic vital signs    He did not show any significant orthostatic changes today.  Initially his symptoms seem to be more along the lines of lightheadedness and we considered volume depletion but he did have some reproducible transient vertigo when going from sitting to supine with head to the right position which suggests probably some mild benign peripheral positional vertigo  -We gave him a handout for Epley maneuvers to the right to do at home.  He has scheduled physical next month.  Follow-up sooner for any persistent or worsening dizziness or any new symptoms  No orders of the defined types were placed in this encounter.   Follow-up: No follow-ups on file.    Carolann Littler, MD

## 2019-11-14 NOTE — Patient Instructions (Addendum)
Benign Positional Vertigo Vertigo is the feeling that you or your surroundings are moving when they are not. Benign positional vertigo is the most common form of vertigo. This is usually a harmless condition (benign). This condition is positional. This means that symptoms are triggered by certain movements and positions. This condition can be dangerous if it occurs while you are doing something that could cause harm to you or others. This includes activities such as driving or operating machinery. What are the causes? In many cases, the cause of this condition is not known. It may be caused by a disturbance in an area of the inner ear that helps your brain to sense movement and balance. This disturbance can be caused by:  Viral infection (labyrinthitis).  Head injury.  Repetitive motion, such as jumping, dancing, or running. What increases the risk? You are more likely to develop this condition if:  You are a woman.  You are 50 years of age or older. What are the signs or symptoms? Symptoms of this condition usually happen when you move your head or your eyes in different directions. Symptoms may start suddenly, and usually last for less than a minute. They include:  Loss of balance and falling.  Feeling like you are spinning or moving.  Feeling like your surroundings are spinning or moving.  Nausea and vomiting.  Blurred vision.  Dizziness.  Involuntary eye movement (nystagmus). Symptoms can be mild and cause only minor problems, or they can be severe and interfere with daily life. Episodes of benign positional vertigo may return (recur) over time. Symptoms may improve over time. How is this diagnosed? This condition may be diagnosed based on:  Your medical history.  Physical exam of the head, neck, and ears.  Tests, such as: ? MRI. ? CT scan. ? Eye movement tests. Your health care provider may ask you to change positions quickly while he or she watches you for symptoms  of benign positional vertigo, such as nystagmus. Eye movement may be tested with a variety of exams that are designed to evaluate or stimulate vertigo. ? An electroencephalogram (EEG). This records electrical activity in your brain. ? Hearing tests. You may be referred to a health care provider who specializes in ear, nose, and throat (ENT) problems (otolaryngologist) or a provider who specializes in disorders of the nervous system (neurologist). How is this treated?  This condition may be treated in a session in which your health care provider moves your head in specific positions to adjust your inner ear back to normal. Treatment for this condition may take several sessions. Surgery may be needed in severe cases, but this is rare. In some cases, benign positional vertigo may resolve on its own in 2-4 weeks. Follow these instructions at home: Safety  Move slowly. Avoid sudden body or head movements or certain positions, as told by your health care provider.  Avoid driving until your health care provider says it is safe for you to do so.  Avoid operating heavy machinery until your health care provider says it is safe for you to do so.  Avoid doing any tasks that would be dangerous to you or others if vertigo occurs.  If you have trouble walking or keeping your balance, try using a cane for stability. If you feel dizzy or unstable, sit down right away.  Return to your normal activities as told by your health care provider. Ask your health care provider what activities are safe for you. General instructions  Take over-the-counter   and prescription medicines only as told by your health care provider.  Drink enough fluid to keep your urine pale yellow.  Keep all follow-up visits as told by your health care provider. This is important. Contact a health care provider if:  You have a fever.  Your condition gets worse or you develop new symptoms.  Your family or friends notice any  behavioral changes.  You have nausea or vomiting that gets worse.  You have numbness or a "pins and needles" sensation. Get help right away if you:  Have difficulty speaking or moving.  Are always dizzy.  Faint.  Develop severe headaches.  Have weakness in your legs or arms.  Have changes in your hearing or vision.  Develop a stiff neck.  Develop sensitivity to light. Summary  Vertigo is the feeling that you or your surroundings are moving when they are not. Benign positional vertigo is the most common form of vertigo.  The cause of this condition is not known. It may be caused by a disturbance in an area of the inner ear that helps your brain to sense movement and balance.  Symptoms include loss of balance and falling, feeling that you or your surroundings are moving, nausea and vomiting, and blurred vision.  This condition can be diagnosed based on symptoms, physical exam, and other tests, such as MRI, CT scan, eye movement tests, and hearing tests.  Follow safety instructions as told by your health care provider. You will also be told when to contact your health care provider in case of problems. This information is not intended to replace advice given to you by your health care provider. Make sure you discuss any questions you have with your health care provider. Document Revised: 06/30/2017 Document Reviewed: 06/30/2017 Elsevier Patient Education  [employer].     If you have lab work done today you will be contacted with your lab results within the next 2 weeks.  If you have not heard from [place] then please contact us. The fastest way to get your results is to register for My Chart.   IF you received an x-ray today, you will receive an invoice from [facility]. Please contact [facility] at [phone] with questions or concerns regarding your invoice.   IF you received labwork today, you will receive an invoice from [facility]. Please contact  [facility] at [phone] with questions or concerns regarding your invoice.   Our billing staff will not be able to assist you with questions regarding bills from these companies.  You will be contacted with the lab results as soon as they are available. The fastest way to get your results is to activate your My Chart account. Instructions are located on the last page of this paperwork. If you have not heard from [place] regarding the results in 2 weeks, please contact this office.

## 2019-11-16 ENCOUNTER — Encounter: Payer: Self-pay | Admitting: Adult Health

## 2019-11-17 ENCOUNTER — Emergency Department
Admission: RE | Admit: 2019-11-17 | Discharge: 2019-11-17 | Disposition: A | Discharge status: Home / Self Care | Payer: BC Managed Care – PPO | Source: Ambulatory Visit

## 2019-11-17 ENCOUNTER — Other Ambulatory Visit: Payer: Self-pay

## 2019-11-17 ENCOUNTER — Emergency Department (INDEPENDENT_AMBULATORY_CARE_PROVIDER_SITE_OTHER): Payer: BC Managed Care – PPO

## 2019-11-17 VITALS — BP 118/78 | HR 72 | Temp 98.6°F | Resp 16

## 2019-11-17 DIAGNOSIS — M5442 Lumbago with sciatica, left side: Secondary | ICD-10-CM

## 2019-11-17 DIAGNOSIS — M4316 Spondylolisthesis, lumbar region: Secondary | ICD-10-CM | POA: Diagnosis not present

## 2019-11-17 HISTORY — DX: Generalized anxiety disorder: F41.1

## 2019-11-17 MED ORDER — TIZANIDINE HCL 4 MG PO TABS: 4.0000 mg | ORAL_TABLET | Freq: Every day | ORAL | 0 refills | Status: DC

## 2019-11-17 MED ORDER — KETOROLAC TROMETHAMINE 60 MG/2ML IM SOLN
60.0000 mg | Freq: Once | INTRAMUSCULAR | Status: AC
  Administered 2019-11-17: 60 mg via INTRAMUSCULAR

## 2019-11-17 MED ORDER — PREDNISONE 20 MG PO TABS: 40.0000 mg | ORAL_TABLET | Freq: Every day | ORAL | 0 refills | Status: DC

## 2019-11-17 MED ORDER — DEXAMETHASONE SODIUM PHOSPHATE 10 MG/ML IJ SOLN
10.0000 mg | Freq: Once | INTRAMUSCULAR | Status: AC
  Administered 2019-11-17: 10 mg via INTRAMUSCULAR

## 2019-11-17 NOTE — Discharge Instructions (Addendum)
I prescribed tizanidine 4 mg for her to take at bedtime for muscle pain.  You may repeat dose during the day if needed however avoid driving or any activities that require focus as medication may cause drowsiness.  I have prescribed you a prednisone taper.  If back pain persist after medication received here in the clinic I recommend starting prednisone dose on 11/16/2019.  If symptoms worsen or do not improve I have provided you with the sports medicine provider to follow-up with for further evaluation and management.

## 2019-11-17 NOTE — ED Provider Notes (Signed)
[facility]    CSN: [id number] Arrival date & time: 11/17/19  4193      History   Chief Complaint Chief Complaint  Patient presents with  . Back Pain    10:00 appt    HPI Ordean Fouts is a 34 y.o. male.   HPI  Patient presents with acute onset lumbar spine pain.  Patient reports that he and wife recently had a newborn who is approximately 3 months and he has had some generalized low back pain however over the course of the last 2 days he has developed acute onset left-sided lumbar pain that is radiating down into his left extremity.  He is experiencing some numbness and tingling in the left extremity.  Pain is worse with lying down changes in position but appears to resolve with standing up straight.  Patient is a principal and endorses walking frequently however is unaware of any injury that may have occurred.  He does not have any known history of chronic back pain.  Past Medical History:  Diagnosis Date  . Chicken pox   . Generalized anxiety disorder     There are no problems to display for this patient.   History reviewed. No pertinent surgical history.     Home Medications    Prior to Admission medications   Medication Sig Start Date End Date Taking? Authorizing Provider  ibuprofen (ADVIL) 400 MG tablet Take 400 mg by mouth every 6 (six) hours as needed.   Yes [provider]  citalopram (CELEXA) 10 MG tablet Take 1 tablet (10 mg total) by mouth daily. 08/10/19   Dorothyann Peng, NP    Family History Family History  Problem Relation Age of Onset  . Elevated Lipids Mother   . Diabetes Paternal Grandfather   . Heart attack Maternal Grandfather   . Prostate cancer Father 48          . Cancer Father     Social History Social History   Tobacco Use  . Smoking status: Former Smoker    Packs/day: 0.25    Years: 5.00    Pack years: 1.25  . Smokeless tobacco: Former Systems developer  . Tobacco comment: quit 15 years ago  Substance Use Topics    . Alcohol use: Yes    Comment: socially  . Drug use: No     Allergies   Patient has no known allergies.   Review of Systems Review of Systems Pertinent negatives listed in HPI  Physical Exam Triage Vital Signs ED Triage Vitals  Enc Vitals Group     BP 11/17/19 1018 118/78     Pulse Rate 11/17/19 1018 72     Resp 11/17/19 1018 16     Temp 11/17/19 1018 98.6 F (37 C)     Temp Source 11/17/19 1018 Oral     SpO2 11/17/19 1018 98 %     Weight --      Height --      Head Circumference --      Peak Flow --      Pain Score 11/17/19 1015 7     Pain Loc --      Pain Edu? --      Excl. in [place]? --    No data found.  Updated Vital Signs BP 118/78 (BP Location: Right Arm)   Pulse 72   Temp 98.6 F (37 C) (Oral)   Resp 16   SpO2 98%   Visual Acuity Right Eye Distance:  Left Eye Distance:   Bilateral Distance:    Right Eye Near:   Left Eye Near:    Bilateral Near:     Physical Exam Constitutional:      Appearance: Normal appearance.  Cardiovascular:     Rate and Rhythm: Normal rate and regular rhythm.     Pulses: Normal pulses.  Musculoskeletal:     Lumbar back: Tenderness present. Decreased range of motion. Positive right straight leg raise test and positive left straight leg raise test.  Skin:    Capillary Refill: Capillary refill takes less than 2 seconds.  Neurological:     General: No focal deficit present.     Mental Status: He is alert and oriented to person, place, and time.  Psychiatric:        Mood and Affect: Mood normal.        Behavior: Behavior normal.        Thought Content: Thought content normal.        Judgment: Judgment normal.      UC Treatments / Results  Labs (all labs ordered are listed, but only abnormal results are displayed) Labs Reviewed - No data to display  EKG   Radiology DG Lumbar Spine 2-3 Views  Result Date: 11/17/2019 CLINICAL DATA:  Acute onset back pain. EXAM: LUMBAR SPINE - 2-3 VIEW COMPARISON:  04/13/2017  CT abdomen pelvis. FINDINGS: There is no evidence of lumbar spine fracture. 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 Disc spaces are grossly preserved. IMPRESSION: No acute osseous abnormality. 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 Electronically Signed   By: Primitivo Gauze M.D.   On: 11/17/2019 11:25    Procedures Procedures (including critical care time)  Medications Ordered in UC Medications  ketorolac (TORADOL) injection 60 mg (has no administration in time range)  dexamethasone (DECADRON) injection 10 mg (has no administration in time range)    Initial Impression / Assessment and Plan / UC Course  I have reviewed the triage vital signs and the nursing notes.  Pertinent labs & imaging results that were available during my care of the patient were reviewed by me and considered in my medical decision making (see chart for details).     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  Per x-ray is a grade 1 which is typically treated with conservative measures.  Patient received Toradol and Decadron here in clinic today.  Prescribed a short course of prednisone orally to start tomorrow if symptoms have not resolved.  Provided rehab exercises to perform at home.  Patient also intending to start yoga which also recommended as well.  Information to follow-up with sports medicine specialist if pain worsens or does not improve with prescribed therapy.  Patient verbalized understanding and agreement with plan.   Final Clinical Impressions(s) / UC Diagnoses   Final diagnoses:  Acute left-sided low back pain with left-sided sciatica   Discharge Instructions   None    ED Prescriptions    None     PDMP not reviewed this encounter.   Scot Jun, FNP 11/17/19 1147

## 2019-11-17 NOTE — ED Triage Notes (Addendum)
Pt presents to Urgent Care with c/o lower back pain and L buttocks pain w/ shooting pain and tingling down L leg to foot x 2 days. Taking ibuprofen w/o relief.

## 2019-11-18 ENCOUNTER — Telehealth: Payer: Self-pay | Admitting: Emergency Medicine

## 2019-11-18 NOTE — Telephone Encounter (Signed)
Paulita Cradle called to see about scripts not being available at [facility]. RN took abe's information and promise to check and give him a call back. Call to pharmacy to phone in scripts (prednisone & tizanidine) [facility] was down for part of the day on 11/13/19. Call back to pt, no answer, voice mail left  to explain the situation and let him know medicine had been called in.

## 2019-11-23 ENCOUNTER — Ambulatory Visit: Payer: BC Managed Care – PPO | Admitting: Adult Health

## 2019-12-04 ENCOUNTER — Ambulatory Visit: Payer: BC Managed Care – PPO | Admitting: Family Medicine

## 2019-12-05 ENCOUNTER — Ambulatory Visit: Payer: BC Managed Care – PPO | Admitting: Adult Health

## 2019-12-05 ENCOUNTER — Other Ambulatory Visit: Payer: Self-pay

## 2019-12-05 ENCOUNTER — Encounter: Payer: Self-pay | Admitting: Adult Health

## 2019-12-05 VITALS — BP 118/72 | HR 62 | Temp 98.0°F | Ht 72.0 in | Wt 181.2 lb

## 2019-12-05 DIAGNOSIS — M5442 Lumbago with sciatica, left side: Secondary | ICD-10-CM | POA: Diagnosis not present

## 2019-12-05 MED ORDER — CYCLOBENZAPRINE HCL 5 MG PO TABS: 5.0000 mg | ORAL_TABLET | Freq: Every day | ORAL | 0 refills | Status: DC

## 2019-12-05 MED ORDER — METHYLPREDNISOLONE 4 MG PO TBPK: ORAL_TABLET | ORAL | 0 refills | Status: DC

## 2019-12-05 NOTE — Progress Notes (Signed)
Subjective:    Patient ID: Sergio Bray, male    DOB: Mar 04, 1985, 34 y.o.   MRN: [id number]  HPI 34 year old male who  has a past medical history of Chicken pox and Generalized anxiety disorder.  He was seen at urgent care roughly 2 weeks ago with acute onset lumbar spine pain with radiating numbness and tingling down his left lower extremity.  Pain was worse when laying down, changing positions but resolved when he was standing up straight.  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  Received an injection of Toradol 60 mg and Decadron 10 mg while in urgent care and was sent home with a short course of tizanidine and a 4-day course of oral prednisone.  Reports that at this time his pain level was 9 out of 10, currently 3-4 out of 10.  Reports that he continues to have intermittent episodes of numbness down his left lower leg with mild low back pain that is felt as "tightness nagging".  Low back pain is worse in the morning.  Resolves when standing straight.  Tizanidine caused GI issues and he stopped taking it after a couple of doses   Review of Systems See HPI   Past Medical History:  Diagnosis Date  . Chicken pox   . Generalized anxiety disorder     Social History   Socioeconomic History  . Marital status: Married    Spouse name: Not on file  . Number of children: Not on file  . Years of education: Not on file  . Highest education level: Not on file  Occupational History  . Not on file  Tobacco Use  . Smoking status: Former Smoker    Packs/day: 0.25    Years: 5.00    Pack years: 1.25  . Smokeless tobacco: Former Systems developer  . Tobacco comment: quit 15 years ago  Substance and Sexual Activity  . Alcohol use: Yes    Comment: socially  . Drug use: No  . Sexual activity: Yes  Other Topics Concern  . 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   His wife is a Pharmacist, hospital  as well.    Social Determinants of Health   Financial Resource Strain:   . Difficulty of Paying Living Expenses: Not on file  Food Insecurity:   . Worried About Charity fundraiser in the Last Year: Not on file  . Ran Out of Food in the Last Year: Not on file  Transportation Needs:   . Lack of Transportation (Medical): Not on file  . Lack of Transportation (Non-Medical): Not on file  Physical Activity:   . Days of Exercise per Week: Not on file  . Minutes of Exercise per Session: Not on file  Stress:   . Feeling of Stress : Not on file  Social Connections:   . Frequency of Communication with Friends and Family: Not on file  . Frequency of Social Gatherings with Friends and Family: Not on file  . Attends Religious Services: Not on file  . Active Member of Clubs or Organizations: Not on file  . Attends Archivist Meetings: Not on file  . Marital Status: Not on file  Intimate Partner Violence:   . Fear of Current or Ex-Partner: Not on file  . Emotionally Abused: Not on file  . Physically Abused: Not on file  . Sexually  Abused: Not on file    History reviewed. No pertinent surgical history.  Family History  Problem Relation Age of Onset  . Elevated Lipids Mother   . Diabetes Paternal Grandfather   . Heart attack Maternal Grandfather   . Prostate cancer Father 52          . Cancer Father     No Known Allergies  Current Outpatient Medications on File Prior to Visit  Medication Sig Dispense Refill  . citalopram (CELEXA) 10 MG tablet Take 1 tablet (10 mg total) by mouth daily. 90 tablet 1   No current facility-administered medications on file prior to visit.    BP 118/72 (BP Location: Left Arm, Patient Position: Sitting, Cuff Size: Normal)   Pulse 62   Temp 98 F (36.7 C) (Oral)   Ht 6' (1.829 m)   Wt 181 lb 3.2 oz (82.2 kg)   BMI 24.58 kg/m       Objective:   Physical Exam Vitals and nursing note reviewed.  Constitutional:      Appearance: Normal  appearance.  Musculoskeletal:        General: No swelling or tenderness. Normal range of motion.  Skin:    General: Skin is warm and dry.     Capillary Refill: Capillary refill takes less than 2 seconds.  Neurological:     General: No focal deficit present.     Mental Status: He is alert and oriented to person, place, and time.     Comments: Slight limping gait    Psychiatric:        Mood and Affect: Mood normal.        Behavior: Behavior normal.        Thought Content: Thought content normal.        Judgment: Judgment normal.       Assessment & Plan:  1. Acute left-sided low back pain with left-sided sciatica -Treat with a Medrol Dosepak and low-dose Flexeril to see if we can resolve this issue.  He was advised to follow-up in the next 3 to 4 days if symptoms have not resolved - methylPREDNISolone (MEDROL DOSEPAK) 4 MG TBPK tablet; Take as directed  Dispense: 21 tablet; Refill: 0 - cyclobenzaprine (FLEXERIL) 5 MG tablet; Take 1 tablet (5 mg total) by mouth at bedtime.  Dispense: 15 tablet; Refill: 0  Dorothyann Peng, NP

## 2019-12-18 ENCOUNTER — Encounter: Payer: Self-pay | Admitting: Adult Health

## 2019-12-25 ENCOUNTER — Other Ambulatory Visit: Payer: Self-pay | Admitting: Orthopedic Surgery

## 2019-12-25 DIAGNOSIS — M545 Low back pain, unspecified: Secondary | ICD-10-CM

## 2020-01-14 ENCOUNTER — Other Ambulatory Visit: Payer: BC Managed Care – PPO

## 2020-02-02 ENCOUNTER — Other Ambulatory Visit: Payer: Self-pay | Admitting: Adult Health

## 2020-02-02 DIAGNOSIS — F419 Anxiety disorder, unspecified: Secondary | ICD-10-CM

## 2020-04-19 ENCOUNTER — Encounter: Payer: Self-pay | Admitting: Adult Health

## 2020-04-25 ENCOUNTER — Encounter: Payer: BC Managed Care – PPO | Admitting: Adult Health

## 2020-05-30 ENCOUNTER — Encounter: Payer: Self-pay | Admitting: Adult Health

## 2020-06-20 ENCOUNTER — Encounter: Payer: Self-pay | Admitting: Adult Health

## 2020-06-28 ENCOUNTER — Ambulatory Visit: Payer: Self-pay | Admitting: Adult Health

## 2020-07-03 ENCOUNTER — Ambulatory Visit: Payer: Self-pay | Admitting: Adult Health

## 2020-07-09 ENCOUNTER — Ambulatory Visit (INDEPENDENT_AMBULATORY_CARE_PROVIDER_SITE_OTHER): Payer: BC Managed Care – PPO | Admitting: Adult Health

## 2020-07-09 ENCOUNTER — Other Ambulatory Visit: Payer: Self-pay

## 2020-07-09 ENCOUNTER — Encounter: Payer: Self-pay | Admitting: Adult Health

## 2020-07-09 VITALS — BP 110/80 | HR 67 | Temp 97.6°F | Ht 72.25 in | Wt 182.0 lb

## 2020-07-09 DIAGNOSIS — F419 Anxiety disorder, unspecified: Secondary | ICD-10-CM

## 2020-07-09 DIAGNOSIS — Z0000 Encounter for general adult medical examination without abnormal findings: Secondary | ICD-10-CM | POA: Diagnosis not present

## 2020-07-09 LAB — CBC WITH DIFFERENTIAL/PLATELET
Basophils Absolute: 0 10*3/uL (ref 0.0–0.1)
Basophils Relative: 0.4 % (ref 0.0–3.0)
Eosinophils Absolute: 0 10*3/uL (ref 0.0–0.7)
Eosinophils Relative: 0.3 % (ref 0.0–5.0)
HCT: 45 % (ref 39.0–52.0)
Hemoglobin: 15.6 g/dL (ref 13.0–17.0)
Lymphocytes Relative: 20.5 % (ref 12.0–46.0)
Lymphs Abs: 1.3 10*3/uL (ref 0.7–4.0)
MCHC: 34.8 g/dL (ref 30.0–36.0)
MCV: 89.6 fl (ref 78.0–100.0)
Monocytes Absolute: 0.4 10*3/uL (ref 0.1–1.0)
Monocytes Relative: 6.3 % (ref 3.0–12.0)
Neutro Abs: 4.5 10*3/uL (ref 1.4–7.7)
Neutrophils Relative %: 72.5 % (ref 43.0–77.0)
Platelets: 188 10*3/uL (ref 150.0–400.0)
RBC: 5.02 Mil/uL (ref 4.22–5.81)
RDW: 13.2 % (ref 11.5–15.5)
WBC: 6.3 10*3/uL (ref 4.0–10.5)

## 2020-07-09 LAB — TSH: TSH: 0.93 u[IU]/mL (ref 0.35–4.50)

## 2020-07-09 MED ORDER — CITALOPRAM HYDROBROMIDE 10 MG PO TABS: 10.0000 mg | ORAL_TABLET | Freq: Every day | ORAL | 1 refills | Status: DC

## 2020-07-09 NOTE — Progress Notes (Signed)
Subjective:    Patient ID: Sergio Bray, male    DOB: 06/26/1985, 35 y.o.   MRN: [id number]  HPI Patient presents for yearly preventative medicine examination. Pleasant 35 year old male who  has a past medical history of Chicken pox and Generalized anxiety disorder.  Anxiety -prescribe Celexa 10 mg.  He feels well controlled on this medication. Denies side effects    All immunizations and health maintenance protocols were reviewed with the patient and needed orders were placed.  Appropriate screening laboratory values were ordered for the patient including screening of hyperlipidemia, renal function and hepatic function.  Medication reconciliation,  past medical history, social history, problem list and allergies were reviewed in detail with the patient  Goals were established with regard to weight loss, exercise, and  diet in compliance with medications Stays active - playing pickup basketball and eating healthy   Has no acute issues    Review of Systems  Constitutional: Negative.   HENT: Negative.   Eyes: Negative.   Respiratory: Negative.   Cardiovascular: Negative.   Gastrointestinal: Negative.   Endocrine: Negative.   Genitourinary: Negative.   Musculoskeletal: Negative.   Skin: Negative.   Allergic/Immunologic: Negative.   Neurological: Negative.   Hematological: Negative.   Psychiatric/Behavioral: Negative.   All other systems reviewed and are negative.    Past Medical History:  Diagnosis Date  . Chicken pox   . Generalized anxiety disorder     Social History   Socioeconomic History  . Marital status: Married    Spouse name: Not on file  . Number of children: Not on file  . Years of education: Not on file  . Highest education level: Not on file  Occupational History  . Not on file  Tobacco Use  . Smoking status: Former Smoker    Packs/day: 0.25    Years: 5.00    Pack years: 1.25  . Smokeless tobacco: Former Systems developer  . Tobacco comment: quit 15  years ago  Substance and Sexual Activity  . Alcohol use: Yes    Comment: socially  . Drug use: No  . Sexual activity: Yes  Other Topics Concern  . 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   His wife is a Pharmacist, hospital as well.    Social Determinants of Health   Financial Resource Strain: Not on file  Food Insecurity: Not on file  Transportation Needs: Not on file  Physical Activity: Not on file  Stress: Not on file  Social Connections: Not on file  Intimate Partner Violence: Not on file    History reviewed. No pertinent surgical history.  Family History  Problem Relation Age of Onset  . Elevated Lipids Mother   . Diabetes Paternal Grandfather   . Heart attack Maternal Grandfather   . Prostate cancer Father 35          . Cancer Father     No Known Allergies  No current outpatient medications on file prior to visit.   No current facility-administered medications on file prior to visit.    BP 110/80   Pulse 67   Temp 97.6 F (36.4 C) (Oral)   Ht 6' 0.25" (1.835 m)   Wt 182 lb (82.6 kg)   SpO2 98%   BMI 24.51 kg/m       Objective:   Physical Exam Vitals and nursing note reviewed.  Constitutional:      General: He  is not in acute distress.    Appearance: Normal appearance. He is well-developed and normal weight.  HENT:     Head: Normocephalic and atraumatic.     Right Ear: Tympanic membrane, ear canal and external ear normal. There is no impacted cerumen.     Left Ear: Tympanic membrane, ear canal and external ear normal. There is no impacted cerumen.     Nose: Nose normal. No congestion or rhinorrhea.     Mouth/Throat:     Mouth: Mucous membranes are moist.     Pharynx: Oropharynx is clear. No oropharyngeal exudate or posterior oropharyngeal erythema.  Eyes:     General:        Right eye: No discharge.        Left eye: No discharge.     Extraocular Movements: Extraocular movements intact.      Conjunctiva/sclera: Conjunctivae normal.     Pupils: Pupils are equal, round, and reactive to light.  Neck:     Vascular: No carotid bruit.     Trachea: No tracheal deviation.  Cardiovascular:     Rate and Rhythm: Normal rate and regular rhythm.     Pulses: Normal pulses.     Heart sounds: Normal heart sounds. No murmur heard. No friction rub. No gallop.   Pulmonary:     Effort: Pulmonary effort is normal. No respiratory distress.     Breath sounds: Normal breath sounds. No stridor. No wheezing, rhonchi or rales.  Chest:     Chest wall: No tenderness.  Abdominal:     General: Bowel sounds are normal. There is no distension.     Palpations: Abdomen is soft. There is no mass.     Tenderness: There is no abdominal tenderness. There is no right CVA tenderness, left CVA tenderness, guarding or rebound.     Hernia: No hernia is present.  Musculoskeletal:        General: No swelling, tenderness, deformity or signs of injury. Normal range of motion.     Right lower leg: No edema.     Left lower leg: No edema.  Lymphadenopathy:     Cervical: No cervical adenopathy.  Skin:    General: Skin is warm and dry.     Capillary Refill: Capillary refill takes less than 2 seconds.     Coloration: Skin is not jaundiced or pale.     Findings: No bruising, erythema, lesion or rash.  Neurological:     General: No focal deficit present.     Mental Status: He is alert and oriented to person, place, and time.     Cranial Nerves: No cranial nerve deficit.     Sensory: No sensory deficit.     Motor: No weakness.     Coordination: Coordination normal.     Gait: Gait normal.     Deep Tendon Reflexes: Reflexes normal.  Psychiatric:        Mood and Affect: Mood normal.        Behavior: Behavior normal.        Thought Content: Thought content normal.        Judgment: Judgment normal.       Assessment & Plan:  1. Routine general medical examination at a health care facility - very healthy 35 year old  male  - Follow up in one year or sooner if needed - TSH; Future - Comprehensive metabolic panel; Future - CBC with Differential/Platelet; Future  2. Anxiety - Controlled.  - citalopram (CELEXA) 10 MG tablet;  Take 1 tablet (10 mg total) by mouth daily.  Dispense: 90 tablet; Refill: 1   Dorothyann Peng, NP

## 2020-07-10 ENCOUNTER — Ambulatory Visit: Payer: Self-pay | Admitting: Adult Health

## 2020-07-10 LAB — COMPREHENSIVE METABOLIC PANEL
ALT: 32 U/L (ref 0–53)
AST: 19 U/L (ref 0–37)
Albumin: 4.8 g/dL (ref 3.5–5.2)
Alkaline Phosphatase: 57 U/L (ref 39–117)
BUN: 19 mg/dL (ref 6–23)
CO2: 26 mEq/L (ref 19–32)
Calcium: 9.6 mg/dL (ref 8.4–10.5)
Chloride: 102 mEq/L (ref 96–112)
Creatinine, Ser: 1.01 mg/dL (ref 0.40–1.50)
GFR: 95.99 mL/min (ref >60.00)
Glucose, Bld: 120 mg/dL — ABNORMAL HIGH (ref 70–99)
Potassium: 4.2 mEq/L (ref 3.5–5.1)
Sodium: 140 mEq/L (ref 135–145)
Total Bilirubin: 0.5 mg/dL (ref 0.2–1.2)
Total Protein: 7.2 g/dL (ref 6.0–8.3)

## 2020-10-05 ENCOUNTER — Encounter: Payer: Self-pay | Admitting: Adult Health

## 2020-10-08 NOTE — Telephone Encounter (Signed)
Please advise 

## 2020-11-16 ENCOUNTER — Encounter: Payer: Self-pay | Admitting: Adult Health

## 2020-11-18 ENCOUNTER — Other Ambulatory Visit: Payer: Self-pay | Admitting: Adult Health

## 2020-11-18 MED ORDER — LORAZEPAM 0.5 MG PO TABS: 0.5000 mg | ORAL_TABLET | Freq: Three times a day (TID) | ORAL | 0 refills | Status: DC

## 2020-11-18 NOTE — Telephone Encounter (Signed)
Please advise 

## 2020-12-16 ENCOUNTER — Ambulatory Visit: Payer: BC Managed Care – PPO | Admitting: Family Medicine

## 2020-12-16 VITALS — BP 114/84 | HR 74 | Temp 98.2°F | Wt 182.0 lb

## 2020-12-16 DIAGNOSIS — H6123 Impacted cerumen, bilateral: Secondary | ICD-10-CM

## 2020-12-16 DIAGNOSIS — R059 Cough, unspecified: Secondary | ICD-10-CM | POA: Diagnosis not present

## 2020-12-16 DIAGNOSIS — J069 Acute upper respiratory infection, unspecified: Secondary | ICD-10-CM

## 2020-12-16 DIAGNOSIS — H1033 Unspecified acute conjunctivitis, bilateral: Secondary | ICD-10-CM | POA: Diagnosis not present

## 2020-12-16 LAB — POC COVID19 BINAXNOW: SARS Coronavirus 2 Ag: NEGATIVE

## 2020-12-16 MED ORDER — POLYMYXIN B-TRIMETHOPRIM 10000-0.1 UNIT/ML-% OP SOLN: 1.0000 [drp] | OPHTHALMIC | 0 refills | Status: AC

## 2020-12-16 MED ORDER — BENZONATATE 100 MG PO CAPS: 100.0000 mg | ORAL_CAPSULE | Freq: Two times a day (BID) | ORAL | 0 refills | Status: DC | PRN

## 2020-12-16 NOTE — Progress Notes (Signed)
Subjective:    Patient ID: Sergio Bray, male    DOB: 1985-03-21, 35 y.o.   MRN: [id number]  Chief Complaint  Patient presents with   Cough    Started on 11/10, lost voice and did not come back until the 12. Discharge in rt eyes, now is red. Sore throat in the beginning, fet like swallowing glass but has gotten better. Has only taken tylenol sinus, thought it was ust a sinus infection.    HPI Patient was seen today for acute concern.  Pt with cough since last wk, worse at night.  Lost voice last wk for 2-3 days.  Sore throat felt like he was "swallowing glass", has resolved.  L eye became red with d/c and matted in am, improved. Now R eye red, with d/c, and matted eyelid in am.  At home COVID testing negative.  Sick contacts include pt's wife and 2 boys.  Pt tried Tylenol and Tylenol sinus for symptoms.  Past Medical History:  Diagnosis Date   Chicken pox    Generalized anxiety disorder     No Known Allergies  ROS General: Denies fever, chills, night sweats, changes in weight, changes in appetite HEENT: Denies headaches, ear pain, changes in vision, rhinorrhea, sore throat + Nasal congestion, eye irritation and drainage.  Sore throat improved. CV: Denies CP, palpitations, SOB, orthopnea  Pulm: Denies SOB, cough, wheezing +cough GI: Denies abdominal pain, nausea, vomiting, diarrhea, constipation GU: Denies dysuria, hematuria, frequency Msk: Denies muscle cramps, joint pains Neuro: Denies weakness, numbness, tingling Skin: Denies rashes, bruising Psych: Denies depression, anxiety, hallucinations      Objective:    Blood pressure 114/84, pulse 74, temperature 98.2 F (36.8 C), temperature source Oral, weight 182 lb (82.6 kg), SpO2 99 %.  Gen. Pleasant, well-nourished, in no distress, normal affect   HEENT: [place]/AT, face symmetric, conjunctival injections, no scleral icterus, PERRLA, EOMI, nares patent without drainage, pharynx with clear drainage, no erythema or exudate. B/l  canals occluded with cerumen.  TMs normal after irrigation.  No TTP of sinuses.   Lungs: no accessory muscle use, CTAB, no wheezes or rales Cardiovascular: RRR, no m/r/g, no peripheral edema Musculoskeletal: No deformities, no cyanosis or clubbing, normal tone Neuro:  A&Ox3, CN II-XII intact, normal gait  Wt Readings from Last 3 Encounters:  07/09/20 182 lb (82.6 kg)  12/05/19 181 lb 3.2 oz (82.2 kg)  11/14/19 183 lb 6.4 oz (83.2 kg)    Lab Results  Component Value Date   WBC 6.3 07/09/2020   HGB 15.6 07/09/2020   HCT 45.0 07/09/2020   PLT 188.0 07/09/2020   GLUCOSE 120 (H) 07/09/2020   CHOL 159 09/27/2018   TRIG 111.0 09/27/2018   HDL 54.30 09/27/2018   LDLCALC 83 09/27/2018   ALT 32 07/09/2020   AST 19 07/09/2020   NA 140 07/09/2020   K 4.2 07/09/2020   CL 102 07/09/2020   CREATININE 1.01 07/09/2020   BUN 19 07/09/2020   CO2 26 07/09/2020   TSH 0.93 07/09/2020    Assessment/Plan:  Viral URI -likely acute nasopharyngitis -continue supportive care rest, hydration, OTC cough/cold medications -given handout -given precautions  Cough, unspecified type -COVID testing negative in clinic -continue supportive care  - Plan: POC COVID-19, benzonatate (TESSALON) 100 MG capsule  Acute conjunctivitis of both eyes, unspecified acute conjunctivitis type  -given handout - Plan: trimethoprim-polymyxin b (POLYTRIM) ophthalmic solution  Bilateral impacted cerumen -consent obtained.  B/l ears irrigated.  Pt tolerated procedure well -given handout  F/u  prn  Grier Mitts, MD

## 2020-12-18 ENCOUNTER — Ambulatory Visit: Payer: BC Managed Care – PPO | Admitting: Adult Health

## 2020-12-27 IMAGING — DX DG LUMBAR SPINE 2-3V
3 series · 3 of 3 positions shown · non-contrast
Comparison: 04/13/2017 CT abdomen pelvis.

CLINICAL DATA: Acute onset back pain.

EXAM:
LUMBAR SPINE - 2-3 VIEW

[l-spine ap]
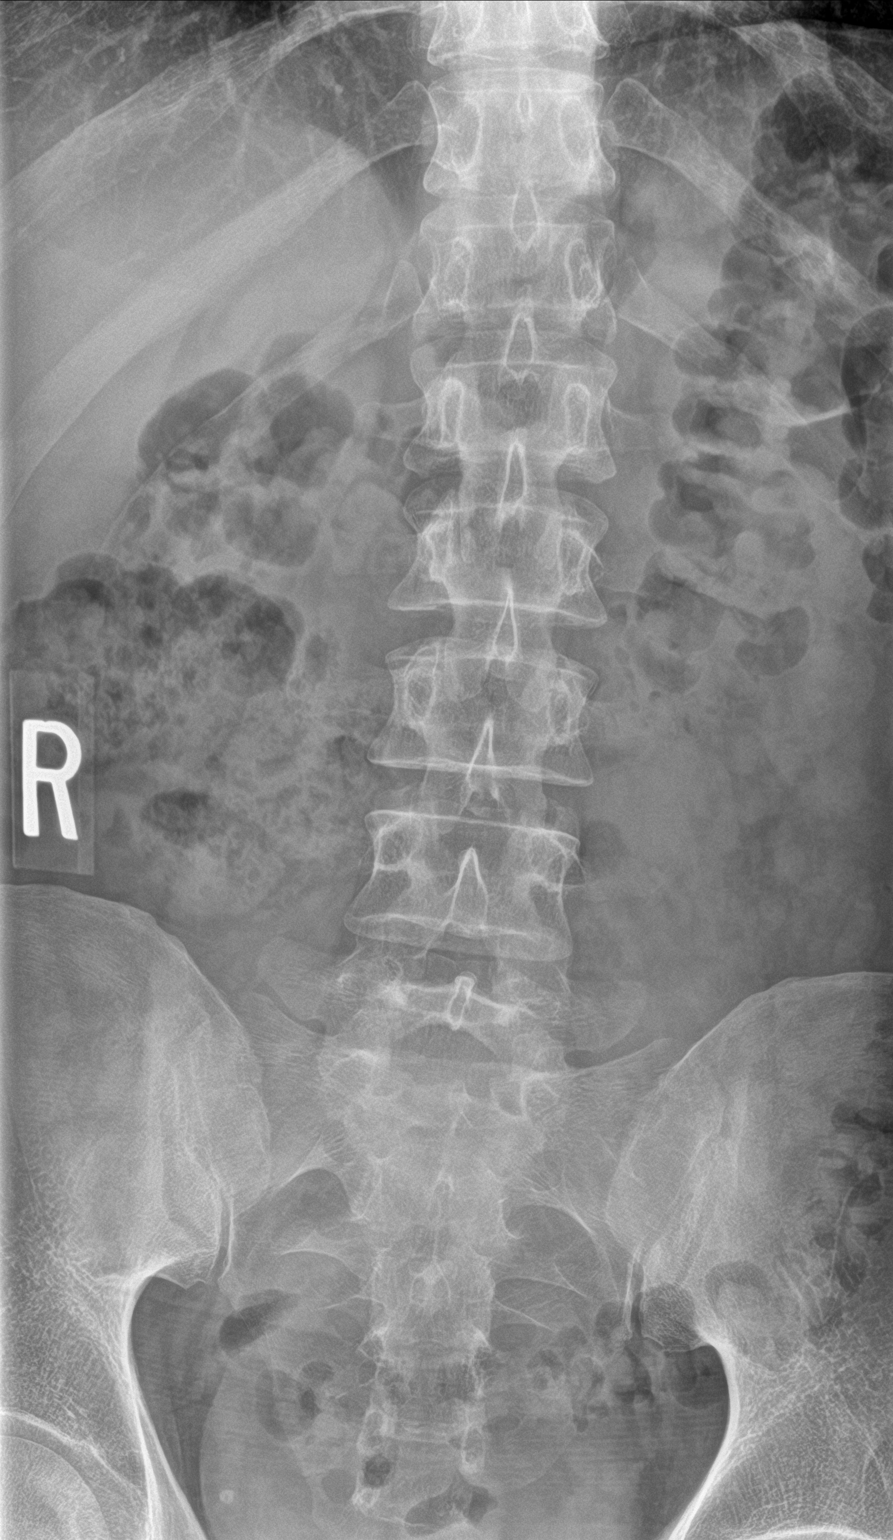

[l-spine lat]
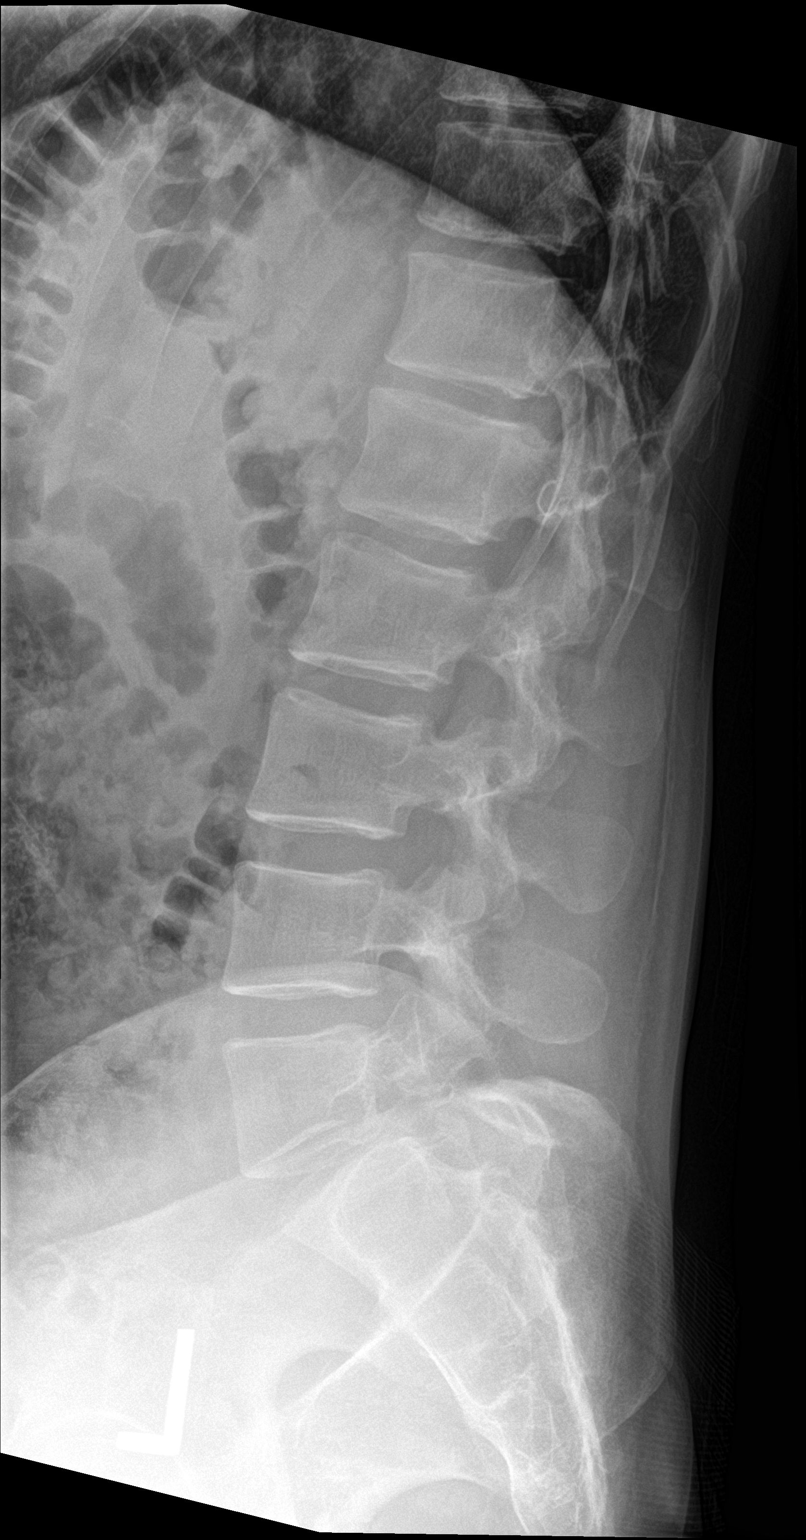

[l-spine spot]
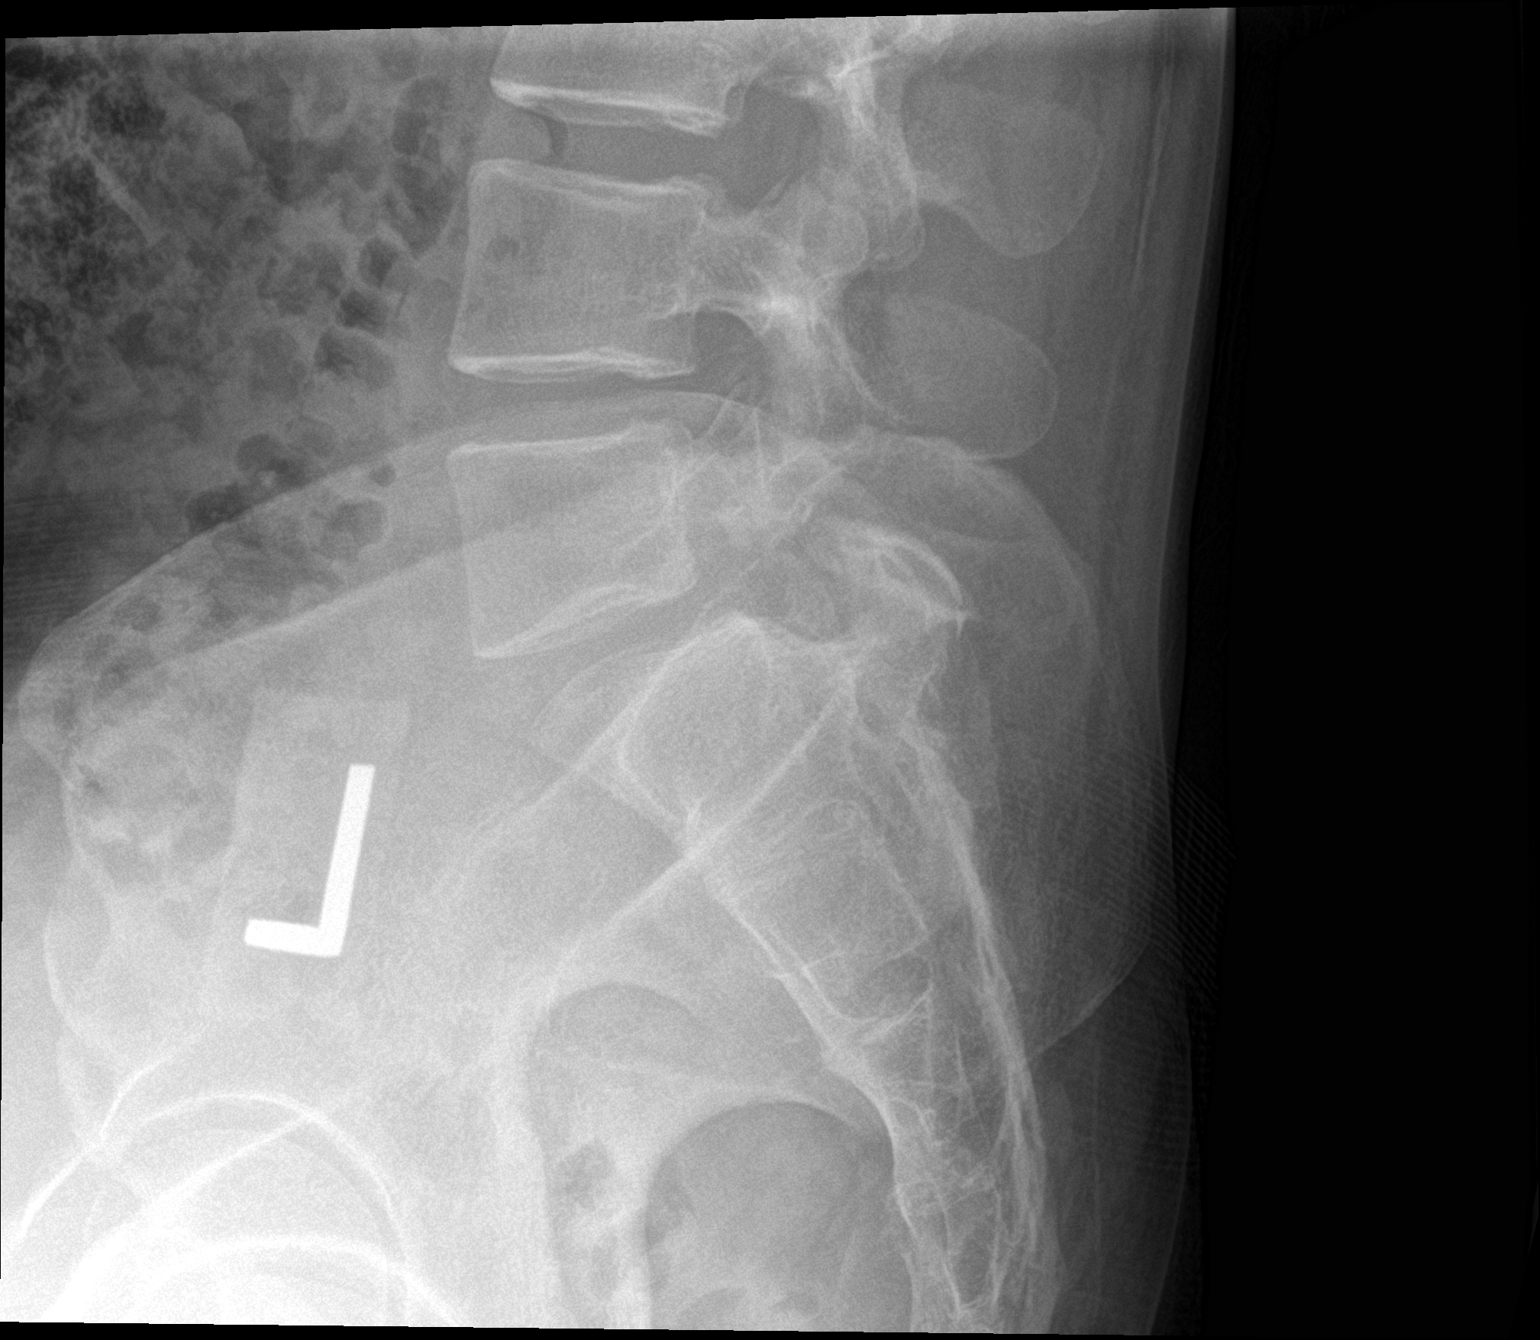

[3 of 3 positions shown; findings below may reference images not displayed]

FINDINGS: There is no evidence of lumbar spine fracture. Grade 1 L5-S1
anterolisthesis with bilateral pars defects. Disc spaces are grossly
preserved.
IMPRESSION: No acute osseous abnormality.

Grade 1 L5-S1 anterolisthesis with bilateral pars defects.

## 2021-04-09 ENCOUNTER — Ambulatory Visit: Payer: BC Managed Care – PPO | Admitting: Adult Health

## 2021-04-10 ENCOUNTER — Other Ambulatory Visit: Payer: Self-pay | Admitting: Adult Health

## 2021-04-10 DIAGNOSIS — F419 Anxiety disorder, unspecified: Secondary | ICD-10-CM

## 2021-05-01 ENCOUNTER — Encounter: Payer: Self-pay | Admitting: Adult Health

## 2021-05-02 ENCOUNTER — Ambulatory Visit: Payer: BC Managed Care – PPO | Admitting: Adult Health

## 2021-05-06 ENCOUNTER — Ambulatory Visit: Payer: BC Managed Care – PPO | Admitting: Adult Health

## 2021-05-10 ENCOUNTER — Encounter: Payer: Self-pay | Admitting: Family Medicine

## 2021-05-29 ENCOUNTER — Ambulatory Visit: Payer: BC Managed Care – PPO | Admitting: Adult Health

## 2021-06-05 ENCOUNTER — Ambulatory Visit: Payer: BC Managed Care – PPO | Admitting: Adult Health

## 2021-06-10 ENCOUNTER — Ambulatory Visit: Payer: BC Managed Care – PPO | Admitting: Adult Health

## 2021-07-07 ENCOUNTER — Encounter: Payer: Self-pay | Admitting: Adult Health

## 2021-07-08 ENCOUNTER — Ambulatory Visit: Payer: BC Managed Care – PPO | Admitting: Adult Health

## 2021-07-09 ENCOUNTER — Encounter: Payer: Self-pay | Admitting: Adult Health

## 2021-07-09 ENCOUNTER — Ambulatory Visit: Payer: BC Managed Care – PPO | Admitting: Adult Health

## 2021-07-09 VITALS — BP 100/70 | HR 59 | Temp 97.9°F | Ht 72.03 in | Wt 178.0 lb

## 2021-07-09 DIAGNOSIS — F419 Anxiety disorder, unspecified: Secondary | ICD-10-CM

## 2021-07-09 DIAGNOSIS — Z1159 Encounter for screening for other viral diseases: Secondary | ICD-10-CM

## 2021-07-09 DIAGNOSIS — Z0000 Encounter for general adult medical examination without abnormal findings: Secondary | ICD-10-CM | POA: Diagnosis not present

## 2021-07-09 DIAGNOSIS — Z114 Encounter for screening for human immunodeficiency virus [HIV]: Secondary | ICD-10-CM | POA: Diagnosis not present

## 2021-07-09 LAB — CBC WITH DIFFERENTIAL/PLATELET
Basophils Absolute: 0 10*3/uL (ref 0.0–0.1)
Basophils Relative: 0.5 % (ref 0.0–3.0)
Eosinophils Absolute: 0 10*3/uL (ref 0.0–0.7)
Eosinophils Relative: 0.4 % (ref 0.0–5.0)
HCT: 45.8 % (ref 39.0–52.0)
Hemoglobin: 15.8 g/dL (ref 13.0–17.0)
Lymphocytes Relative: 20.6 % (ref 12.0–46.0)
Lymphs Abs: 1 10*3/uL (ref 0.7–4.0)
MCHC: 34.5 g/dL (ref 30.0–36.0)
MCV: 90.5 fl (ref 78.0–100.0)
Monocytes Absolute: 0.4 10*3/uL (ref 0.1–1.0)
Monocytes Relative: 8.4 % (ref 3.0–12.0)
Neutro Abs: 3.4 10*3/uL (ref 1.4–7.7)
Neutrophils Relative %: 70.1 % (ref 43.0–77.0)
Platelets: 190 10*3/uL (ref 150.0–400.0)
RBC: 5.06 Mil/uL (ref 4.22–5.81)
RDW: 13 % (ref 11.5–15.5)
WBC: 4.9 10*3/uL (ref 4.0–10.5)

## 2021-07-09 LAB — COMPREHENSIVE METABOLIC PANEL
ALT: 16 U/L (ref 0–53)
AST: 15 U/L (ref 0–37)
Albumin: 4.9 g/dL (ref 3.5–5.2)
Alkaline Phosphatase: 64 U/L (ref 39–117)
BUN: 20 mg/dL (ref 6–23)
CO2: 29 mEq/L (ref 19–32)
Calcium: 9.8 mg/dL (ref 8.4–10.5)
Chloride: 101 mEq/L (ref 96–112)
Creatinine, Ser: 0.97 mg/dL (ref 0.40–1.50)
GFR: 100.06 mL/min (ref >60.00)
Glucose, Bld: 85 mg/dL (ref 70–99)
Potassium: 4 mEq/L (ref 3.5–5.1)
Sodium: 139 mEq/L (ref 135–145)
Total Bilirubin: 0.7 mg/dL (ref 0.2–1.2)
Total Protein: 7.5 g/dL (ref 6.0–8.3)

## 2021-07-09 LAB — TSH: TSH: 1.22 u[IU]/mL (ref 0.35–5.50)

## 2021-07-09 NOTE — Progress Notes (Signed)
Subjective:    Patient ID: Sergio Bray, male    DOB: 1985/10/11, 36 y.o.   MRN: [id number]  HPI Patient presents for yearly preventative medicine examination. He is a pleasant 36 year old male who  has a past medical history of Chicken pox and Generalized anxiety disorder.  Anxiety -managed with Celexa 10 mg daily. In the past he has felt well controlled on this medication over the last week or so he has been experiencing increased stress related anxiety to the point where he will feel dizzy to the point where he feels like he may pass out. He is able to do some CBT and his symptoms will resolve. 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 Prior to his anxiety systems increasing he was inconsistent with taking his medications daily. He recently just restarting taking celexa daily.   All immunizations and health maintenance protocols were reviewed with the patient and needed orders were placed.  Appropriate screening laboratory values were ordered for the patient including screening of hyperlipidemia, renal function and hepatic function.   Medication reconciliation,  past medical history, social history, problem list and allergies were reviewed in detail with the patient  Goals were established with regard to weight loss, exercise, and  diet in compliance with medications. He is eating much healthier and exercising almost every day   Wt Readings from Last 3 Encounters:  07/09/21 178 lb (80.7 kg)  12/16/20 182 lb (82.6 kg)  07/09/20 182 lb (82.6 kg)    Review of Systems  Constitutional: Negative.   HENT: Negative.    Eyes: Negative.   Respiratory: Negative.    Cardiovascular: Negative.   Gastrointestinal: Negative.   Endocrine: Negative.   Genitourinary: Negative.   Musculoskeletal: Negative.   Skin: Negative.   Allergic/Immunologic: Negative.   Neurological: Negative.   Hematological: Negative.    Psychiatric/Behavioral: Negative.    All other systems reviewed and are negative. Past Medical History:  Diagnosis Date   Chicken pox    Generalized anxiety disorder     Social History   Socioeconomic History   Marital status: Married    Spouse name: Not on file   Number of children: Not on file   Years of education: Not on file   Highest education level: Master's degree (e.g., MA, MS, MEng, MEd, MSW, MBA)  Occupational History   Not on file  Tobacco Use   Smoking status: Former    Packs/day: 0.25    Years: 5.00    Pack years: 1.25    Types: Cigarettes   Smokeless tobacco: Former   Tobacco comments:    quit 15 years ago  Substance and Sexual Activity   Alcohol use: Yes    Comment: socially   Drug use: No   Sexual activity: Yes  Other Topics Concern   Not on file  Social History Narrative   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   His wife is a Pharmacist, hospital as well.    Social Determinants of Health   Financial Resource Strain: Low Risk    Difficulty of Paying Living Expenses: Not hard at all  Food Insecurity: No Food Insecurity   Worried About Charity fundraiser in the Last Year: Never true   [place] in the Last Year: Never true  Transportation Needs: No Transportation Needs   Lack of Transportation (Medical): No   Lack of  Transportation (Non-Medical): No  Physical Activity: Sufficiently Active   Days of Exercise per Week: 4 days   Minutes of Exercise per Session: 40 min  Stress: No Stress Concern Present   Feeling of Stress : Not at all  Social Connections: Unknown   Frequency of Communication with Friends and Family: More than three times a week   Frequency of Social Gatherings with Friends and Family: Once a week   Attends Religious Services: Never   Marine scientist or Organizations: Patient refused   Attends Archivist Meetings: Not on file   Marital Status: Married  Human resources officer Violence: Not on file    History  reviewed. No pertinent surgical history.  Family History  Problem Relation Age of Onset   Elevated Lipids Mother    Diabetes Paternal Grandfather    Heart attack Maternal Grandfather    Prostate cancer Father 30           Cancer Father     No Known Allergies  Current Outpatient Medications on File Prior to Visit  Medication Sig Dispense Refill   citalopram (CELEXA) 10 MG tablet TAKE 1 TABLET BY MOUTH EVERY DAY 90 tablet 1   LORazepam (ATIVAN) 0.5 MG tablet Take 1 tablet (0.5 mg total) by mouth every 8 (eight) hours. 30 tablet 0   No current facility-administered medications on file prior to visit.    BP 100/70   Pulse (!) 59   Temp 97.9 F (36.6 C) (Oral)   Ht 6' 0.03" (1.83 m)   Wt 178 lb (80.7 kg)   SpO2 98%   BMI 24.12 kg/m       Objective:   Physical Exam Vitals and nursing note reviewed.  Constitutional:      General: He is not in acute distress.    Appearance: Normal appearance. He is well-developed and normal weight.  HENT:     Head: Normocephalic and atraumatic.     Right Ear: Tympanic membrane, ear canal and external ear normal. There is no impacted cerumen.     Left Ear: Tympanic membrane, ear canal and external ear normal. There is no impacted cerumen.     Nose: Nose normal. No congestion or rhinorrhea.     Mouth/Throat:     Mouth: Mucous membranes are moist.     Pharynx: Oropharynx is clear. No oropharyngeal exudate or posterior oropharyngeal erythema.  Eyes:     General:        Right eye: No discharge.        Left eye: No discharge.     Extraocular Movements: Extraocular movements intact.     Conjunctiva/sclera: Conjunctivae normal.     Pupils: Pupils are equal, round, and reactive to light.  Neck:     Vascular: No carotid bruit.     Trachea: No tracheal deviation.  Cardiovascular:     Rate and Rhythm: Normal rate and regular rhythm.     Pulses: Normal pulses.     Heart sounds: Normal heart sounds. No murmur heard.   No friction rub. No  gallop.  Pulmonary:     Effort: Pulmonary effort is normal. No respiratory distress.     Breath sounds: Normal breath sounds. No stridor. No wheezing, rhonchi or rales.  Chest:     Chest wall: No tenderness.  Abdominal:     General: Bowel sounds are normal. There is no distension.     Palpations: Abdomen is soft. There is no mass.     Tenderness: There is  no abdominal tenderness. There is no right CVA tenderness, left CVA tenderness, guarding or rebound.     Hernia: No hernia is present.  Musculoskeletal:        General: No swelling, tenderness, deformity or signs of injury. Normal range of motion.     Right lower leg: No edema.     Left lower leg: No edema.  Lymphadenopathy:     Cervical: No cervical adenopathy.  Skin:    General: Skin is warm and dry.     Capillary Refill: Capillary refill takes less than 2 seconds.     Coloration: Skin is not jaundiced or pale.     Findings: No bruising, erythema, lesion or rash.  Neurological:     General: No focal deficit present.     Mental Status: He is alert and oriented to person, place, and time.     Cranial Nerves: No cranial nerve deficit.     Sensory: No sensory deficit.     Motor: No weakness.     Coordination: Coordination normal.     Gait: Gait normal.     Deep Tendon Reflexes: Reflexes normal.  Psychiatric:        Mood and Affect: Mood normal.        Behavior: Behavior normal.        Thought Content: Thought content normal.        Judgment: Judgment normal.      Assessment & Plan:  1. Routine general medical examination at a health care facility - Follow up in one year or sooner if needed - Benign exam  - CBC with Differential/Platelet; Future - Comprehensive metabolic panel; Future - TSH; Future - TSH - Comprehensive metabolic panel - CBC with Differential/Platelet  2. Anxiety - Will have him continue with Celexa 10 mg daily  - Anxiety should resolve once medication gets back into his system   3. Need for  hepatitis C screening test  - Hep C Antibody; Future - Hep C Antibody  4. Encounter for screening for HIV  - HIV Antibody (routine testing w rflx); Future - HIV Antibody (routine testing w rflx)  Dorothyann Peng, NP

## 2021-07-10 LAB — HIV ANTIBODY (ROUTINE TESTING W REFLEX): HIV 1&2 Ab, 4th Generation: NONREACTIVE

## 2021-07-10 LAB — HEPATITIS C ANTIBODY
Hepatitis C Ab: NONREACTIVE
SIGNAL TO CUT-OFF: 0.18 (ref <1.00)

## 2021-07-31 ENCOUNTER — Encounter: Payer: Self-pay | Admitting: Adult Health

## 2021-08-01 ENCOUNTER — Ambulatory Visit: Payer: Self-pay

## 2021-08-01 ENCOUNTER — Emergency Department
Admission: RE | Admit: 2021-08-01 | Discharge: 2021-08-01 | Disposition: A | Discharge status: Home / Self Care | Payer: BC Managed Care – PPO | Source: Ambulatory Visit

## 2021-08-01 ENCOUNTER — Other Ambulatory Visit: Payer: Self-pay

## 2021-08-01 VITALS — BP 115/72 | HR 55 | Temp 97.8°F | Resp 18 | Ht 71.0 in | Wt 174.0 lb

## 2021-08-01 DIAGNOSIS — S39012A Strain of muscle, fascia and tendon of lower back, initial encounter: Secondary | ICD-10-CM | POA: Diagnosis not present

## 2021-08-01 DIAGNOSIS — M62830 Muscle spasm of back: Secondary | ICD-10-CM | POA: Diagnosis not present

## 2021-08-01 MED ORDER — PREDNISONE 20 MG PO TABS: ORAL_TABLET | ORAL | 0 refills | Status: DC

## 2021-08-01 MED ORDER — BACLOFEN 10 MG PO TABS: 10.0000 mg | ORAL_TABLET | Freq: Three times a day (TID) | ORAL | 0 refills | Status: DC

## 2021-08-01 NOTE — Discharge Instructions (Addendum)
Instructed patient to take medication as directed with food to completion.  Advised may use baclofen daily or as needed for accompanying muscle spasms of lower back.  Encouraged patient to increase daily water intake while taking these medications.  Advised patient if symptoms worsen and/or unresolved please follow-up with PCP or here for further evaluation.

## 2021-08-01 NOTE — ED Triage Notes (Signed)
Left Low back pain x 2.5 days, has been icing and heating pad, ibuprofen. Going out of town tomorrow. Was treated here about 2 years ago for same problem.

## 2021-08-01 NOTE — ED Provider Notes (Signed)
[facility]    CSN: [id number] Arrival date & time: 08/01/21  0836      History   Chief Complaint Chief Complaint  Patient presents with   Back Pain    Entered by patient    HPI Sergio Bray is a 36 y.o. male.   HPI 36 year old male presents with left-sided low back pain for 2.5 days and has been icing this area as well as using heating pad.  Reports using ibuprofen as needed.  Reports going out of town tomorrow and was treated for this similar issue roughly 2 years ago.  Past Medical History:  Diagnosis Date   Chicken pox    Generalized anxiety disorder     There are no problems to display for this patient.   History reviewed. No pertinent surgical history.     Home Medications    Prior to Admission medications   Medication Sig Start Date End Date Taking? Authorizing Provider  baclofen (LIORESAL) 10 MG tablet Take 1 tablet (10 mg total) by mouth 3 (three) times daily. 08/01/21  Yes Eliezer Lofts, FNP  ibuprofen (ADVIL) 600 MG tablet Take 600 mg by mouth every 6 (six) hours as needed.   Yes [provider]  predniSONE (DELTASONE) 20 MG tablet Take 3 tabs PO daily x 5 days. 08/01/21  Yes Eliezer Lofts, FNP  citalopram (CELEXA) 10 MG tablet TAKE 1 TABLET BY MOUTH EVERY DAY 04/11/21   Nafziger, Tommi Rumps, NP    Family History Family History  Problem Relation Age of Onset   Elevated Lipids Mother    Diabetes Paternal Grandfather    Heart attack Maternal Grandfather    Prostate cancer Father 3           Cancer Father     Social History Social History   Tobacco Use   Smoking status: Former    Packs/day: 0.25    Years: 5.00    Total pack years: 1.25    Types: Cigarettes   Smokeless tobacco: Former   Tobacco comments:    quit 15 years ago  Vaping Use   Vaping Use: Never used  Substance Use Topics   Alcohol use: Yes    Comment: socially   Drug use: No     Allergies   Patient has no known allergies.   Review of  Systems Review of Systems  Musculoskeletal:  Positive for back pain.  All other systems reviewed and are negative.    Physical Exam Triage Vital Signs ED Triage Vitals  Enc Vitals Group     BP 08/01/21 0846 115/72     Pulse Rate 08/01/21 0846 (!) 55     Resp 08/01/21 0846 18     Temp 08/01/21 0846 97.8 F (36.6 C)     Temp Source 08/01/21 0846 Oral     SpO2 08/01/21 0846 100 %     Weight 08/01/21 0847 174 lb (78.9 kg)     Height 08/01/21 0847 '5\' 11"'$  (1.803 m)     Head Circumference --      Peak Flow --      Pain Score 08/01/21 0847 6     Pain Loc --      Pain Edu? --      Excl. in [place]? --    No data found.  Updated Vital Signs BP 115/72 (BP Location: Left Arm)   Pulse (!) 55   Temp 97.8 F (36.6 C) (Oral)   Resp 18   Ht '5\' 11"'$  (1.803  m)   Wt 174 lb (78.9 kg)   SpO2 100%   BMI 24.27 kg/m      Physical Exam Vitals and nursing note reviewed.  Constitutional:      Appearance: Normal appearance. He is normal weight.  HENT:     Head: Normocephalic and atraumatic.     Mouth/Throat:     Mouth: Mucous membranes are moist.     Pharynx: Oropharynx is clear.  Eyes:     Extraocular Movements: Extraocular movements intact.     Conjunctiva/sclera: Conjunctivae normal.     Pupils: Pupils are equal, round, and reactive to light.  Cardiovascular:     Rate and Rhythm: Normal rate and regular rhythm.     Pulses: Normal pulses.     Heart sounds: Normal heart sounds. No murmur heard. Pulmonary:     Effort: Pulmonary effort is normal.     Breath sounds: Normal breath sounds. No wheezing, rhonchi or rales.  Musculoskeletal:     Cervical back: Normal range of motion and neck supple.     Comments: Left-sided lower back: TTP over left sided inferior paraspinous/spinal erector muscles  Skin:    General: Skin is warm and dry.  Neurological:     General: No focal deficit present.     Mental Status: He is alert and oriented to person, place, and time. Mental status is at  baseline.      UC Treatments / Results  Labs (all labs ordered are listed, but only abnormal results are displayed) Labs Reviewed - No data to display  EKG   Radiology No results found.  Procedures Procedures (including critical care time)  Medications Ordered in UC Medications - No data to display  Initial Impression / Assessment and Plan / UC Course  I have reviewed the triage vital signs and the nursing notes.  Pertinent labs & imaging results that were available during my care of the patient were reviewed by me and considered in my medical decision making (see chart for details).     MDM: 1.  Strain of lumbar region, initial encounter-Rx'd prednisone; 2.  Muscle spasm of back-Rx'd Baclofen. Instructed patient to take medication as directed with food to completion.  Advised may use baclofen daily or as needed for accompanying muscle spasms of lower back.  Encouraged patient to increase daily water intake while taking these medications.  Advised patient if symptoms worsen and/or unresolved please follow-up with PCP or here for further evaluation.  Patient discharged home, hemodynamically stable.  Final Clinical Impressions(s) / UC Diagnoses   Final diagnoses:  Strain of lumbar region, initial encounter  Muscle spasm of back     Discharge Instructions      Instructed patient to take medication as directed with food to completion.  Advised may use baclofen daily or as needed for accompanying muscle spasms of lower back.  Encouraged patient to increase daily water intake while taking these medications.  Advised patient if symptoms worsen and/or unresolved please follow-up with PCP or here for further evaluation.     ED Prescriptions     Medication Sig Dispense Auth. Provider   predniSONE (DELTASONE) 20 MG tablet Take 3 tabs PO daily x 5 days. 15 tablet Eliezer Lofts, FNP   baclofen (LIORESAL) 10 MG tablet Take 1 tablet (10 mg total) by mouth 3 (three) times daily.  55 each Eliezer Lofts, FNP      PDMP not reviewed this encounter.   Eliezer Lofts, [place] 08/01/21 [phone]

## 2021-11-20 ENCOUNTER — Encounter: Payer: Self-pay | Admitting: Adult Health

## 2021-11-21 ENCOUNTER — Other Ambulatory Visit: Payer: Self-pay | Admitting: Adult Health

## 2021-11-21 MED ORDER — LORAZEPAM 0.5 MG PO TABS: 0.5000 mg | ORAL_TABLET | Freq: Two times a day (BID) | ORAL | 0 refills | Status: DC | PRN

## 2021-11-21 NOTE — Telephone Encounter (Signed)
Please advise 

## 2021-12-01 ENCOUNTER — Telehealth: Payer: BC Managed Care – PPO | Admitting: Physician Assistant

## 2021-12-01 ENCOUNTER — Encounter: Payer: Self-pay | Admitting: Adult Health

## 2021-12-01 DIAGNOSIS — J019 Acute sinusitis, unspecified: Secondary | ICD-10-CM

## 2021-12-01 DIAGNOSIS — B9789 Other viral agents as the cause of diseases classified elsewhere: Secondary | ICD-10-CM

## 2021-12-01 MED ORDER — AZELASTINE HCL 0.1 % NA SOLN: 1.0000 | Freq: Two times a day (BID) | NASAL | 0 refills | Status: DC

## 2021-12-01 NOTE — Progress Notes (Signed)
E-Visit for Sinus Problems  We are sorry that you are not feeling well.  Here is how we plan to help!  Based on what you have shared with me it looks like you have sinusitis.  Sinusitis is inflammation and infection in the sinus cavities of the head.  Based on your presentation I believe you most likely have Acute Viral Sinusitis.This is an infection most likely caused by a virus. There is not specific treatment for viral sinusitis other than to help you with the symptoms until the infection runs its course.  You may use an oral decongestant such as Mucinex D or if you have glaucoma or high blood pressure use plain Mucinex. Saline nasal spray help and can safely be used as often as needed for congestion, I have prescribed: Azelastine nasal spray 2 sprays in each nostril twice a day  Some authorities believe that zinc sprays or the use of Echinacea may shorten the course of your symptoms.  Sinus infections are not as easily transmitted as other respiratory infection, however we still recommend that you avoid close contact with loved ones, especially the very young and elderly.  Remember to wash your hands thoroughly throughout the day as this is the number one way to prevent the spread of infection!  Home Care: Only take medications as instructed by your medical team. Do not take these medications with alcohol. A steam or ultrasonic humidifier can help congestion.  You can place a towel over your head and breathe in the steam from hot water coming from a faucet. Avoid close contacts especially the very young and the elderly. Cover your mouth when you cough or sneeze. Always remember to wash your hands.  Get Help Right Away If: You develop worsening fever or sinus pain. You develop a severe head ache or visual changes. Your symptoms persist after you have completed your treatment plan.  Make sure you Understand these instructions. Will watch your condition. Will get help right away if you are  not doing well or get worse.   Thank you for choosing an e-visit.  Your e-visit answers were reviewed by a board certified advanced clinical practitioner to complete your personal care plan. Depending upon the condition, your plan could have included both over the counter or prescription medications.  Please review your pharmacy choice. Make sure the pharmacy is open so you can pick up prescription now. If there is a problem, you may contact your provider through MyChart messaging and have the prescription routed to another pharmacy.  Your safety is important to us. If you have drug allergies check your prescription carefully.   For the next 24 hours you can use MyChart to ask questions about today's visit, request a non-urgent call back, or ask for a work or school excuse. You will get an email in the next two days asking about your experience. I hope that your e-visit has been valuable and will speed your recovery.  I have spent 5 minutes in review of e-visit questionnaire, review and updating patient chart, medical decision making and response to patient.   Obed Samek M Chika Cichowski, PA-C  

## 2022-01-06 ENCOUNTER — Other Ambulatory Visit: Payer: Self-pay | Admitting: Adult Health

## 2022-01-06 DIAGNOSIS — F419 Anxiety disorder, unspecified: Secondary | ICD-10-CM

## 2022-01-12 ENCOUNTER — Telehealth: Payer: BC Managed Care – PPO | Admitting: Family Medicine

## 2022-01-12 ENCOUNTER — Encounter: Payer: Self-pay | Admitting: Family Medicine

## 2022-01-12 VITALS — Temp 98.5°F | Ht 71.0 in | Wt 174.0 lb

## 2022-01-12 DIAGNOSIS — J069 Acute upper respiratory infection, unspecified: Secondary | ICD-10-CM

## 2022-01-12 NOTE — Progress Notes (Signed)
Virtual Visit via Video Note  I connected with Stirling Orton on 01/12/22 at  8:30 AM EST by a video enabled telemedicine application 2/2 [id number] pandemic and verified that I am speaking with the correct person using two identifiers.  Location patient: home Location provider:work or home office Persons participating in the virtual visit: patient, provider Visit started via video however changed to phone call when patient's audio kept going in and out.  I discussed the limitations of evaluation and management by telemedicine and the availability of in person appointments. The patient expressed understanding and agreed to proceed.  Chief Complaint  Patient presents with   Cough    Pt reports sx of cough, nasal congestion, fever-yesterday:100.0, bodyaches-since Saturday,now subsided, a fatigue, minor sore throat. He states he started to feel bad on this past Friday. Currently taking ibuprofen. He informs he had taken 2 covid test-on Friday and last night, both are negative.     HPI: Pt is a 36 yo male with no sig pmh who is followed by Dorothyann Peng, NP and seen for acute concern.  Pt started feeling a little bad on Friday with fatigue and body aches, but was able to do his regular activities.  Sunday had a low grade fever, 100.3 or 100.10F.  COVID test negative on Sunday.  Today mild cough, stuffy nose, minimal sore throat.  No longer having bodyaches, fatigue, or fever.  Denies n/v, ear pain/pressure, facial pain/ pressure, no changes in appetite. Taking ibuprofen q 4 or 5 hrs.    Pt states his 2 kids are sick one with ear infection and the other with strep. 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  ROS: See pertinent positives and negatives per HPI.  Past Medical History:  Diagnosis Date   Chicken pox    Generalized anxiety disorder     No past surgical history on file.  Family History  Problem Relation Age of Onset   Elevated Lipids Mother    Diabetes Paternal Grandfather    Heart  attack Maternal Grandfather    Prostate cancer Father 76           Cancer Father     Current Outpatient Medications:    citalopram (CELEXA) 10 MG tablet, TAKE 1 TABLET BY MOUTH EVERY DAY, Disp: 90 tablet, Rfl: 1   LORazepam (ATIVAN) 0.5 MG tablet, Take 1 tablet (0.5 mg total) by mouth 2 (two) times daily as needed for anxiety., Disp: 15 tablet, Rfl: 0  EXAM:  VITALS per patient if applicable:  RR between 12- 20 bpm  GENERAL: alert, oriented, appears well and in no acute distress  HEENT: atraumatic, conjunctiva clear, no obvious abnormalities on inspection of external nose and ears  NECK: normal movements of the head and neck  LUNGS: on inspection no signs of respiratory distress, breathing rate appears normal, no obvious gross SOB, gasping or wheezing  CV: no obvious cyanosis  MS: moves all visible extremities without noticeable abnormality  PSYCH/NEURO: pleasant and cooperative, no obvious depression or anxiety, speech and thought processing grossly intact  ASSESSMENT AND PLAN:  Discussed the following assessment and plan:  Viral URI with cough -Symptoms starting 01/09/2022 -COVID testing negative yesterday 01/11/2022 -Advised symptoms likely viral. -Continue supportive care.  Advised can alternate Tylenol and ibuprofen, rest, warm fluids, hydration, OTC cough/cold medications, gargling with warm salt water Chloraseptic spray etc.  Follow-up as needed   I discussed the assessment and treatment plan with the patient. The patient was provided an opportunity to ask questions and  all were answered. The patient agreed with the plan and demonstrated an understanding of the instructions.   The patient was advised to call back or seek an in-person evaluation if the symptoms worsen or if the condition fails to improve as anticipated.  An additional 6:00 minutes of non-face-to-face time during this encounter spent via phone after audio was going in and out on video.   Billie Ruddy, MD

## 2022-04-14 ENCOUNTER — Encounter: Payer: Self-pay | Admitting: Adult Health

## 2022-04-16 ENCOUNTER — Telehealth: Payer: BC Managed Care – PPO | Admitting: Adult Health

## 2022-04-16 ENCOUNTER — Encounter: Payer: Self-pay | Admitting: Adult Health

## 2022-04-16 VITALS — Ht 71.0 in | Wt 174.0 lb

## 2022-04-16 DIAGNOSIS — F419 Anxiety disorder, unspecified: Secondary | ICD-10-CM

## 2022-04-16 NOTE — Progress Notes (Signed)
Virtual Visit via Video Note  I connected with Sergio Bray on 04/16/22 at  8:30 AM EDT by a video enabled telemedicine application and verified that I am speaking with the correct person using two identifiers.  Location patient: home Location provider:work or home office Persons participating in the virtual visit: patient, provider  I discussed the limitations of evaluation and management by telemedicine and the availability of in person appointments. The patient expressed understanding and agreed to proceed.   HPI: 37 year old male who is being evaluated today for chronic issue.  He has a history of anxiety that has been pretty well-controlled with Celexa 10 mg daily.  He has been taking his medication.  Over the last month or so he started seeing a therapist is more of a "preventative measure" and this has been very helpful for him.  He is also working out 4-5 times a week and has cut out alcohol from his life.  His sleep is good.  Unfortunately over the last 2 to 3 weeks his anxiety has been creeping up, this only happens when he is driving on the highway.  He has been working on CBT to help with this and has found that cognitive behavioral therapy helpful in managing his symptoms but they are still present.  Anxiety is not disabling his activities of daily living and does not last all day.   ROS: See pertinent positives and negatives per HPI.  Past Medical History:  Diagnosis Date   Chicken pox    Generalized anxiety disorder     History reviewed. No pertinent surgical history.  Family History  Problem Relation Age of Onset   Elevated Lipids Mother    Diabetes Paternal Grandfather    Heart attack Maternal Grandfather    Prostate cancer Father 12           Cancer Father        Current Outpatient Medications:    citalopram (CELEXA) 10 MG tablet, TAKE 1 TABLET BY MOUTH EVERY DAY, Disp: 90 tablet, Rfl: 1   LORazepam (ATIVAN) 0.5 MG tablet, Take 1 tablet (0.5 mg total) by  mouth 2 (two) times daily as needed for anxiety. (Patient taking differently: Take 0.5 mg by mouth 2 (two) times daily as needed for anxiety. PRN only for riding on a plane), Disp: 15 tablet, Rfl: 0  EXAM:  VITALS per patient if applicable:  GENERAL: alert, oriented, appears well and in no acute distress  HEENT: atraumatic, conjunttiva clear, no obvious abnormalities on inspection of external nose and ears  NECK: normal movements of the head and neck  LUNGS: on inspection no signs of respiratory distress, breathing rate appears normal, no obvious gross SOB, gasping or wheezing  CV: no obvious cyanosis  MS: moves all visible extremities without noticeable abnormality  PSYCH/NEURO: pleasant and cooperative, no obvious depression or anxiety, speech and thought processing grossly intact  ASSESSMENT AND PLAN:  Discussed the following assessment and plan:  1. Anxiety -We discussed increasing his medication but ultimately decided in shared decision making not to increase at this time.  He is going to continue to work with cognitive behavioral therapy to see if he can get his anxiety under control.      I discussed the assessment and treatment plan with the patient. The patient was provided an opportunity to ask questions and all were answered. The patient agreed with the plan and demonstrated an understanding of the instructions.   The patient was advised to call back or seek an  in-person evaluation if the symptoms worsen or if the condition fails to improve as anticipated.   Dorothyann Peng, NP

## 2022-05-14 ENCOUNTER — Ambulatory Visit (INDEPENDENT_AMBULATORY_CARE_PROVIDER_SITE_OTHER): Payer: BC Managed Care – PPO | Admitting: Adult Health

## 2022-05-14 ENCOUNTER — Encounter: Payer: Self-pay | Admitting: Adult Health

## 2022-05-14 VITALS — BP 110/68 | HR 62 | Temp 98.1°F | Ht 71.0 in | Wt 187.0 lb

## 2022-05-14 DIAGNOSIS — R42 Dizziness and giddiness: Secondary | ICD-10-CM | POA: Diagnosis not present

## 2022-05-14 DIAGNOSIS — H9201 Otalgia, right ear: Secondary | ICD-10-CM

## 2022-05-14 DIAGNOSIS — H6121 Impacted cerumen, right ear: Secondary | ICD-10-CM | POA: Diagnosis not present

## 2022-05-14 NOTE — Progress Notes (Signed)
Subjective:    Patient ID: Sergio Bray, male    DOB: 02-15-1985, 37 y.o.   MRN: [id number]  HPI 37 year old male who  has a past medical history of Chicken pox and Generalized anxiety disorder.  He presents to the office today for an acute issue of dizziness and right ear pain. He has not had any ear drainage, fevers, chills, or sinus congestion. Dizziness is intermittent and only lasts a few seconds. Dizziness is described as " sitting on a water bed".   Review of Systems See HPI   Past Medical History:  Diagnosis Date   Chicken pox    Generalized anxiety disorder     Social History   Socioeconomic History   Marital status: Married    Spouse name: Not on file   Number of children: Not on file   Years of education: Not on file   Highest education level: Master's degree (e.g., MA, MS, MEng, MEd, MSW, MBA)  Occupational History   Not on file  Tobacco Use   Smoking status: Former    Packs/day: 0.25    Years: 5.00    Additional pack years: 0.00    Total pack years: 1.25    Types: Cigarettes   Smokeless tobacco: Former   Tobacco comments:    quit 15 years ago  Vaping Use   Vaping Use: Never used  Substance and Sexual Activity   Alcohol use: Yes    Comment: socially   Drug use: No   Sexual activity: Yes  Other Topics Concern   Not on file  Social History Narrative   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   His wife is a Runner, broadcasting/film/video as well.    Social Determinants of Health   Financial Resource Strain: Low Risk  (05/13/2022)   Overall Financial Resource Strain (CARDIA)    Difficulty of Paying Living Expenses: Not hard at all  Food Insecurity: No Food Insecurity (05/13/2022)   Hunger Vital Sign    Worried About Running Out of Food in the Last Year: Never true    Ran Out of Food in the Last Year: Never true  Transportation Needs: No Transportation Needs (05/13/2022)   PRAPARE - Administrator, Civil Service (Medical): No    Lack of  Transportation (Non-Medical): No  Physical Activity: Sufficiently Active (05/13/2022)   Exercise Vital Sign    Days of Exercise per Week: 5 days    Minutes of Exercise per Session: 40 min  Stress: No Stress Concern Present (05/13/2022)   [employer] of Occupational Health - Occupational Stress Questionnaire    Feeling of Stress : Not at all  Social Connections: Moderately Integrated (05/13/2022)   Social Connection and Isolation Panel [NHANES]    Frequency of Communication with Friends and Family: Three times a week    Frequency of Social Gatherings with Friends and Family: Once a week    Attends Religious Services: Never    Database administrator or Organizations: Yes    Attends Engineer, structural: More than 4 times per year    Marital Status: Married  Catering manager Violence: Not on file    History reviewed. No pertinent surgical history.  Family History  Problem Relation Age of Onset   Elevated Lipids Mother    Diabetes Paternal Grandfather    Heart attack Maternal Grandfather    Prostate cancer Father 41  Cancer Father     No Known Allergies  Current Outpatient Medications on File Prior to Visit  Medication Sig Dispense Refill   citalopram (CELEXA) 10 MG tablet TAKE 1 TABLET BY MOUTH EVERY DAY 90 tablet 1   LORazepam (ATIVAN) 0.5 MG tablet Take 1 tablet (0.5 mg total) by mouth 2 (two) times daily as needed for anxiety. (Patient taking differently: Take 0.5 mg by mouth 2 (two) times daily as needed for anxiety. PRN only for riding on a plane) 15 tablet 0   No current facility-administered medications on file prior to visit.    BP 110/68   Pulse 62   Temp 98.1 F (36.7 C) (Oral)   Ht 5\' 11"  (1.803 m)   Wt 187 lb (84.8 kg)   SpO2 (!) 9%   BMI 26.08 kg/m       Objective:   Physical Exam Vitals and nursing note reviewed.  Constitutional:      Appearance: Normal appearance.  HENT:     Right Ear: There is impacted cerumen.     Left  Ear: Tympanic membrane, ear canal and external ear normal. There is no impacted cerumen.  Musculoskeletal:        General: Normal range of motion.  Skin:    General: Skin is warm and dry.  Neurological:     General: No focal deficit present.     Mental Status: He is alert and oriented to person, place, and time.  Psychiatric:        Mood and Affect: Mood normal.        Behavior: Behavior normal.        Thought Content: Thought content normal.        Judgment: Judgment normal.       Assessment & Plan:    1. Right ear pain - likely from cerumen impaction   2. Dizziness - likely from cerumen impaction   3. Impacted cerumen of right ear Ear Cerumen Removal  Date/Time: 05/14/2022 2:16 PM  Performed by: Shirline Frees, NP Authorized by: Shirline Frees, NP   Anesthesia: Local Anesthetic: none Location details: right ear Patient tolerance: patient tolerated the procedure well with no immediate complications Comments:  There were no complications and following the disimpaction the tympanic membrane was visible. Tympanic membrane was intact following the procedure.  Auditory canals are normal.  The patient reported relief of symptoms after removal of cerumen.  Procedure type: irrigation  Sedation: Patient sedated: no      Shirline Frees, NP

## 2022-07-02 ENCOUNTER — Other Ambulatory Visit: Payer: Self-pay | Admitting: Adult Health

## 2022-07-02 DIAGNOSIS — F419 Anxiety disorder, unspecified: Secondary | ICD-10-CM

## 2022-08-03 ENCOUNTER — Other Ambulatory Visit: Payer: Self-pay | Admitting: Adult Health

## 2022-08-06 ENCOUNTER — Other Ambulatory Visit: Payer: Self-pay | Admitting: Adult Health

## 2022-08-07 ENCOUNTER — Encounter: Payer: Self-pay | Admitting: Adult Health

## 2022-08-07 NOTE — Telephone Encounter (Signed)
Tried to call pt for more information but no answer.

## 2022-08-07 NOTE — Telephone Encounter (Signed)
Tried to call pt again to get the date of his flight but no answer.

## 2022-08-11 MED ORDER — LORAZEPAM 0.5 MG PO TABS: 0.5000 mg | ORAL_TABLET | Freq: Two times a day (BID) | ORAL | 0 refills | Status: DC | PRN

## 2022-08-11 NOTE — Telephone Encounter (Signed)
Sent in refill  Kristian Covey MD [facility]

## 2022-08-11 NOTE — Telephone Encounter (Signed)
Pt is aware cory not in office today

## 2022-08-11 NOTE — Telephone Encounter (Signed)
Okay for refill?   Please see Mychart message for details.

## 2022-11-03 ENCOUNTER — Ambulatory Visit: Payer: BC Managed Care – PPO | Admitting: Adult Health

## 2022-11-03 ENCOUNTER — Encounter: Payer: Self-pay | Admitting: Adult Health

## 2022-11-03 VITALS — BP 110/78 | HR 58 | Temp 98.0°F | Ht 71.0 in | Wt 173.0 lb

## 2022-11-03 DIAGNOSIS — H6122 Impacted cerumen, left ear: Secondary | ICD-10-CM

## 2022-11-03 NOTE — Progress Notes (Signed)
Subjective:    Patient ID: Sergio Bray, male    DOB: May 16, 1985, 37 y.o.   MRN: [id number]  HPI 37 year old male who  has a past medical history of Chicken pox and Generalized anxiety disorder.  He presents to the office today for concern of cerumen impaction.  He reports leftl ear fullness with dizziness x 1 week.  He has been using wax softening drops  x 3 days without any improvement.   Review of Systems See HPI   Past Medical History:  Diagnosis Date   Chicken pox    Generalized anxiety disorder     Social History   Socioeconomic History   Marital status: Married    Spouse name: Not on file   Number of children: Not on file   Years of education: Not on file   Highest education level: Master's degree (e.g., MA, MS, MEng, MEd, MSW, MBA)  Occupational History   Not on file  Tobacco Use   Smoking status: Former    Current packs/day: 0.25    Average packs/day: 0.3 packs/day for 5.0 years (1.3 ttl pk-yrs)    Types: Cigarettes   Smokeless tobacco: Former   Tobacco comments:    quit 15 years ago  Vaping Use   Vaping status: Never Used  Substance and Sexual Activity   Alcohol use: Yes    Comment: socially   Drug use: No   Sexual activity: Yes  Other Topics Concern   Not on file  Social History Narrative   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   His wife is a Runner, broadcasting/film/video as well.    Social Determinants of Health   Financial Resource Strain: Low Risk  (05/13/2022)   Overall Financial Resource Strain (CARDIA)    Difficulty of Paying Living Expenses: Not hard at all  Food Insecurity: No Food Insecurity (05/13/2022)   Hunger Vital Sign    Worried About Running Out of Food in the Last Year: Never true    Ran Out of Food in the Last Year: Never true  Transportation Needs: No Transportation Needs (05/13/2022)   PRAPARE - Administrator, Civil Service (Medical): No    Lack of Transportation (Non-Medical): No  Physical Activity: Sufficiently  Active (05/13/2022)   Exercise Vital Sign    Days of Exercise per Week: 5 days    Minutes of Exercise per Session: 40 min  Stress: No Stress Concern Present (05/13/2022)   [employer] of Occupational Health - Occupational Stress Questionnaire    Feeling of Stress : Not at all  Social Connections: Moderately Integrated (05/13/2022)   Social Connection and Isolation Panel [NHANES]    Frequency of Communication with Friends and Family: Three times a week    Frequency of Social Gatherings with Friends and Family: Once a week    Attends Religious Services: Never    Database administrator or Organizations: Yes    Attends Engineer, structural: More than 4 times per year    Marital Status: Married  Catering manager Violence: Not on file    No past surgical history on file.  Family History  Problem Relation Age of Onset   Elevated Lipids Mother    Diabetes Paternal Grandfather    Heart attack Maternal Grandfather    Prostate cancer Father 55           Cancer Father     No Known Allergies  Current Outpatient Medications on File  Prior to Visit  Medication Sig Dispense Refill   citalopram (CELEXA) 10 MG tablet TAKE 1 TABLET BY MOUTH EVERY DAY 90 tablet 1   LORazepam (ATIVAN) 0.5 MG tablet Take 1 tablet (0.5 mg total) by mouth 2 (two) times daily as needed for anxiety. 15 tablet 0   No current facility-administered medications on file prior to visit.    BP 110/78   Pulse (!) 58   Temp 98 F (36.7 C) (Oral)   Ht 5\' 11"  (1.803 m)   Wt 173 lb (78.5 kg)   SpO2 96%   BMI 24.13 kg/m       Objective:   Physical Exam Vitals and nursing note reviewed.  Constitutional:      Appearance: Normal appearance.  HENT:     Right Ear: There is no impacted cerumen.     Left Ear: There is impacted cerumen.  Skin:    General: Skin is warm and dry.  Neurological:     General: No focal deficit present.     Mental Status: He is alert and oriented to person, place, and time.   Psychiatric:        Mood and Affect: Mood normal.        Behavior: Behavior normal.        Thought Content: Thought content normal.        Judgment: Judgment normal.       Assessment & Plan:   1. Impacted cerumen of left ear Ear Cerumen Removal  Date/Time: 11/03/2022 2:55 PM  Performed by: Shirline Frees, NP Authorized by: Shirline Frees, NP   Anesthesia: Local Anesthetic: none Location details: left ear Patient tolerance: patient tolerated the procedure well with no immediate complications Procedure type: irrigation  Sedation: Patient sedated: no     Shirline Frees, NP

## 2022-11-03 NOTE — Patient Instructions (Signed)
Health Maintenance Due  Topic Date Due   INFLUENZA VACCINE  Never done   COVID-19 Vaccine (3 - 2023-24 season) 10/04/2022       04/16/2022    8:23 AM 01/12/2022    8:23 AM 07/09/2020    2:27 PM  Depression screen PHQ 2/9  Decreased Interest 0 0 0  Down, Depressed, Hopeless 0 0 0  PHQ - 2 Score 0 0 0

## 2022-12-30 ENCOUNTER — Other Ambulatory Visit: Payer: Self-pay | Admitting: Adult Health

## 2022-12-30 DIAGNOSIS — F419 Anxiety disorder, unspecified: Secondary | ICD-10-CM

## 2023-02-02 ENCOUNTER — Encounter: Payer: Self-pay | Admitting: Adult Health

## 2023-02-02 ENCOUNTER — Other Ambulatory Visit: Payer: Self-pay | Admitting: Family Medicine

## 2023-02-05 MED ORDER — LORAZEPAM 0.5 MG PO TABS: 0.5000 mg | ORAL_TABLET | Freq: Two times a day (BID) | ORAL | 0 refills | Status: DC | PRN

## 2023-06-28 ENCOUNTER — Encounter: Payer: Self-pay | Admitting: Adult Health

## 2023-06-29 ENCOUNTER — Other Ambulatory Visit: Payer: Self-pay | Admitting: Adult Health

## 2023-06-29 MED ORDER — LORAZEPAM 0.5 MG PO TABS: 0.5000 mg | ORAL_TABLET | Freq: Two times a day (BID) | ORAL | 0 refills | Status: AC | PRN

## 2023-07-01 ENCOUNTER — Ambulatory Visit: Admitting: Adult Health

## 2023-07-01 ENCOUNTER — Telehealth (INDEPENDENT_AMBULATORY_CARE_PROVIDER_SITE_OTHER): Admitting: Adult Health

## 2023-07-01 VITALS — Ht 71.0 in | Wt 175.0 lb

## 2023-07-01 DIAGNOSIS — F419 Anxiety disorder, unspecified: Secondary | ICD-10-CM

## 2023-07-01 DIAGNOSIS — R37 Sexual dysfunction, unspecified: Secondary | ICD-10-CM

## 2023-07-01 NOTE — Progress Notes (Signed)
 Virtual Visit via Video Note  I connected with Bobbi Kozakiewicz Fauver on 07/01/23 at 10:00 AM EDT by a video enabled telemedicine application and verified that I am speaking with the correct person using two identifiers.  Location patient: home Location provider:work or home office Persons participating in the virtual visit: patient, provider  I discussed the limitations of evaluation and management by telemedicine and the availability of in person appointments. The patient expressed understanding and agreed to proceed.   HPI: 38 year old male who is being evaluated today for chronic issue.  He has been on Celexa  10 mg for roughly 5 years help with anxiety.  He does get positive benefit does have a lingering sexual dysfunction with mild erectile dysfunction.  This seems to happen intermittently but it is frustrating for him.   ROS: See pertinent positives and negatives per HPI.  Past Medical History:  Diagnosis Date   Chicken pox    Generalized anxiety disorder     No past surgical history on file.  Family History  Problem Relation Age of Onset   Elevated Lipids Mother    Diabetes Paternal Grandfather    Heart attack Maternal Grandfather    Prostate cancer Father 84           Cancer Father        Current Outpatient Medications:    citalopram  (CELEXA ) 10 MG tablet, TAKE 1 TABLET BY MOUTH EVERY DAY, Disp: 90 tablet, Rfl: 1   LORazepam  (ATIVAN ) 0.5 MG tablet, Take 1 tablet (0.5 mg total) by mouth 2 (two) times daily as needed for anxiety., Disp: 15 tablet, Rfl: 0  EXAM:  VITALS per patient if applicable:  GENERAL: alert, oriented, appears well and in no acute distress  HEENT: atraumatic, conjunttiva clear, no obvious abnormalities on inspection of external nose and ears  NECK: normal movements of the head and neck  LUNGS: on inspection no signs of respiratory distress, breathing rate appears normal, no obvious gross SOB, gasping or wheezing  CV: no obvious cyanosis  MS:  moves all visible extremities without noticeable abnormality  PSYCH/NEURO: pleasant and cooperative, no obvious depression or anxiety, speech and thought processing grossly intact  ASSESSMENT AND PLAN:  Discussed the following assessment and plan:  No diagnosis found.  1. Anxiety (Primary) - I will have him decrease his dose to 5 mg daily for the next 2 weeks.  If symptoms improve then we will keep him on this dose but if they continue then we will wean him completely off and can try Wellbutrin  - He will send me a mychart message to let me know how he is doing   2. Sexual dysfunction      I discussed the assessment and treatment plan with the patient. The patient was provided an opportunity to ask questions and all were answered. The patient agreed with the plan and demonstrated an understanding of the instructions.   The patient was advised to call back or seek an in-person evaluation if the symptoms worsen or if the condition fails to improve as anticipated.   Christeen Lai, NP

## 2023-07-04 ENCOUNTER — Encounter: Payer: Self-pay | Admitting: Adult Health

## 2023-07-07 ENCOUNTER — Other Ambulatory Visit: Payer: Self-pay | Admitting: Adult Health

## 2023-07-07 DIAGNOSIS — F419 Anxiety disorder, unspecified: Secondary | ICD-10-CM

## 2023-07-16 ENCOUNTER — Encounter: Payer: Self-pay | Admitting: Adult Health

## 2023-07-16 NOTE — Telephone Encounter (Signed)
**Note De-identified  [place] Obfuscation** Please advise 

## 2023-08-27 ENCOUNTER — Ambulatory Visit (INDEPENDENT_AMBULATORY_CARE_PROVIDER_SITE_OTHER): Admitting: Adult Health

## 2023-08-27 ENCOUNTER — Ambulatory Visit: Payer: Self-pay | Admitting: Adult Health

## 2023-08-27 VITALS — BP 120/82 | HR 58 | Temp 97.6°F | Ht 72.0 in | Wt 177.0 lb

## 2023-08-27 DIAGNOSIS — Z136 Encounter for screening for cardiovascular disorders: Secondary | ICD-10-CM

## 2023-08-27 DIAGNOSIS — Z0000 Encounter for general adult medical examination without abnormal findings: Secondary | ICD-10-CM

## 2023-08-27 DIAGNOSIS — F419 Anxiety disorder, unspecified: Secondary | ICD-10-CM | POA: Diagnosis not present

## 2023-08-27 DIAGNOSIS — R37 Sexual dysfunction, unspecified: Secondary | ICD-10-CM

## 2023-08-27 LAB — LIPID PANEL
Cholesterol: 169 mg/dL (ref 0–200)
HDL: 55 mg/dL (ref >39.00)
LDL Cholesterol: 100 mg/dL — ABNORMAL HIGH (ref 0–99)
NonHDL: 113.99
Total CHOL/HDL Ratio: 3
Triglycerides: 69 mg/dL (ref 0.0–149.0)
VLDL: 13.8 mg/dL (ref 0.0–40.0)

## 2023-08-27 LAB — COMPREHENSIVE METABOLIC PANEL WITH GFR
ALT: 21 U/L (ref 0–53)
AST: 20 U/L (ref 0–37)
Albumin: 4.9 g/dL (ref 3.5–5.2)
Alkaline Phosphatase: 57 U/L (ref 39–117)
BUN: 25 mg/dL — ABNORMAL HIGH (ref 6–23)
CO2: 28 meq/L (ref 19–32)
Calcium: 9.4 mg/dL (ref 8.4–10.5)
Chloride: 103 meq/L (ref 96–112)
Creatinine, Ser: 0.97 mg/dL (ref 0.40–1.50)
GFR: 98.57 mL/min (ref >60.00)
Glucose, Bld: 79 mg/dL (ref 70–99)
Potassium: 3.9 meq/L (ref 3.5–5.1)
Sodium: 139 meq/L (ref 135–145)
Total Bilirubin: 0.5 mg/dL (ref 0.2–1.2)
Total Protein: 7.7 g/dL (ref 6.0–8.3)

## 2023-08-27 LAB — CBC
HCT: 47.1 % (ref 39.0–52.0)
Hemoglobin: 16.1 g/dL (ref 13.0–17.0)
MCHC: 34.2 g/dL (ref 30.0–36.0)
MCV: 90.8 fl (ref 78.0–100.0)
Platelets: 187 K/uL (ref 150.0–400.0)
RBC: 5.19 Mil/uL (ref 4.22–5.81)
RDW: 12.4 % (ref 11.5–15.5)
WBC: 4.2 K/uL (ref 4.0–10.5)

## 2023-08-27 LAB — TSH: TSH: 1.09 u[IU]/mL (ref 0.35–5.50)

## 2023-08-27 NOTE — Progress Notes (Signed)
 Subjective:    Patient ID: Sergio Bray, male    DOB: 13-Jan-1986, 38 y.o.   MRN: [id number]  HPI  Patient presents for yearly preventative medicine examination. He is a pleasant 38 year old male who  has a past medical history of Chicken pox and Generalized anxiety disorder.  Anxiety - He has been on Celexa  10 mg for roughly 5 years help with anxiety.  He did  get positive benefit but was have a lingering sexual dysfunction with mild erectile dysfunction. He started tapeing his dose about 5 weeks ago and has been off Celexa  for the last 3 weeks. He has not had any issues with anxiety since being off and feels good overall but is still having issues with ED and has noticed decreased erections in the morning. He does not have any issues with sleep and no concern for sleep apnea. He does not feel fatigued on a regular basis.    All immunizations and health maintenance protocols were reviewed with the patient and needed orders were placed.  Appropriate screening laboratory values were ordered for the patient including screening of hyperlipidemia, renal function and hepatic function.  Medication reconciliation,  past medical history, social history, problem list and allergies were reviewed in detail with the patient  Goals were established with regard to weight loss, exercise, and  diet in compliance with medications. He does stay active and eat healthy.   Wt Readings from Last 3 Encounters:  08/27/23 177 lb (80.3 kg)  07/01/23 175 lb (79.4 kg)  11/03/22 173 lb (78.5 kg)    Review of Systems  Constitutional: Negative.   HENT: Negative.    Eyes: Negative.   Respiratory: Negative.    Cardiovascular: Negative.   Gastrointestinal: Negative.   Endocrine: Negative.   Genitourinary: Negative.   Musculoskeletal: Negative.   Skin: Negative.   Allergic/Immunologic: Negative.   Neurological: Negative.   Hematological: Negative.   Psychiatric/Behavioral: Negative.    All other systems  reviewed and are negative.  Past Medical History:  Diagnosis Date   Chicken pox    Generalized anxiety disorder     Social History   Socioeconomic History   Marital status: Married    Spouse name: Not on file   Number of children: Not on file   Years of education: Not on file   Highest education level: Doctorate  Occupational History   Not on file  Tobacco Use   Smoking status: Former    Current packs/day: 0.25    Average packs/day: 0.3 packs/day for 5.0 years (1.3 ttl pk-yrs)    Types: Cigarettes   Smokeless tobacco: Former   Tobacco comments:    quit 15 years ago  Vaping Use   Vaping status: Never Used  Substance and Sexual Activity   Alcohol use: Yes    Comment: socially   Drug use: No   Sexual activity: Yes  Other Topics Concern   Not on file  Social History Narrative   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   His wife is a Runner, broadcasting/film/video as well.    Social Drivers of Corporate investment banker Strain: Low Risk  (08/26/2023)   Overall Financial Resource Strain (CARDIA)    Difficulty of Paying Living Expenses: Not hard at all  Food Insecurity: No Food Insecurity (08/26/2023)   Hunger Vital Sign    Worried About Running Out of Food in the Last Year: Never true    Ran Out of Food in  the Last Year: Never true  Transportation Needs: No Transportation Needs (08/26/2023)   PRAPARE - Administrator, Civil Service (Medical): No    Lack of Transportation (Non-Medical): No  Physical Activity: Sufficiently Active (08/26/2023)   Exercise Vital Sign    Days of Exercise per Week: 6 days    Minutes of Exercise per Session: 40 min  Stress: No Stress Concern Present (08/26/2023)   [employer] of Occupational Health - Occupational Stress Questionnaire    Feeling of Stress: Not at all  Social Connections: Moderately Integrated (08/26/2023)   Social Connection and Isolation Panel    Frequency of Communication with Friends and Family: More than three times a  week    Frequency of Social Gatherings with Friends and Family: Twice a week    Attends Religious Services: Patient declined    Database administrator or Organizations: Yes    Attends Banker Meetings: Patient declined    Marital Status: Married  Catering manager Violence: Not on file    No past surgical history on file.  Family History  Problem Relation Age of Onset   Elevated Lipids Mother    Diabetes Paternal Grandfather    Heart attack Maternal Grandfather    Prostate cancer Father 30           Cancer Father     No Known Allergies  Current Outpatient Medications on File Prior to Visit  Medication Sig Dispense Refill   citalopram  (CELEXA ) 10 MG tablet TAKE 1 TABLET BY MOUTH EVERY DAY 90 tablet 1   LORazepam  (ATIVAN ) 0.5 MG tablet Take 1 tablet (0.5 mg total) by mouth 2 (two) times daily as needed for anxiety. 15 tablet 0   No current facility-administered medications on file prior to visit.    BP 120/82   Pulse (!) 58   Temp 97.6 F (36.4 C) (Oral)   Ht 6' (1.829 m)   Wt 177 lb (80.3 kg)   SpO2 98%   BMI 24.01 kg/m       Objective:   Physical Exam Vitals and nursing note reviewed.  Constitutional:      General: He is not in acute distress.    Appearance: Normal appearance. He is not ill-appearing.  HENT:     Head: Normocephalic and atraumatic.     Right Ear: Tympanic membrane, ear canal and external ear normal. There is no impacted cerumen.     Left Ear: Tympanic membrane, ear canal and external ear normal. There is no impacted cerumen.     Nose: Nose normal. No congestion or rhinorrhea.     Mouth/Throat:     Mouth: Mucous membranes are moist.     Pharynx: Oropharynx is clear.  Eyes:     Extraocular Movements: Extraocular movements intact.     Conjunctiva/sclera: Conjunctivae normal.     Pupils: Pupils are equal, round, and reactive to light.  Neck:     Vascular: No carotid bruit.  Cardiovascular:     Rate and Rhythm: Normal rate and  regular rhythm.     Pulses: Normal pulses.     Heart sounds: No murmur heard.    No friction rub. No gallop.  Pulmonary:     Effort: Pulmonary effort is normal.     Breath sounds: Normal breath sounds.  Abdominal:     General: Abdomen is flat. Bowel sounds are normal. There is no distension.     Palpations: Abdomen is soft. There is no mass.  Tenderness: There is no abdominal tenderness. There is no guarding or rebound.     Hernia: No hernia is present.  Musculoskeletal:        General: Normal range of motion.     Cervical back: Normal range of motion and neck supple.  Lymphadenopathy:     Cervical: No cervical adenopathy.  Skin:    General: Skin is warm and dry.     Capillary Refill: Capillary refill takes less than 2 seconds.  Neurological:     General: No focal deficit present.     Mental Status: He is alert and oriented to person, place, and time.  Psychiatric:        Mood and Affect: Mood normal.        Behavior: Behavior normal.        Thought Content: Thought content normal.        Judgment: Judgment normal.       Assessment & Plan:  1. Routine general medical examination at a health care facility (Primary) Today patient counseled on age appropriate routine health concerns for screening and prevention, each reviewed and up to date or declined. Immunizations reviewed and up to date or declined. Labs ordered and reviewed. Risk factors for depression reviewed and negative. Hearing function and visual acuity are intact. ADLs screened and addressed as needed. Functional ability and level of safety reviewed and appropriate. Education, counseling and referrals performed based on assessed risks today. Patient provided with a copy of personalized plan for preventive services.   2. Anxiety - Resolved.  - Lipid panel; Future - TSH; Future - CBC; Future - Comprehensive metabolic panel with GFR; Future  3. Sexual dysfunction - Will check testosterone panel and consider  referral to Urology.  - Lipid panel; Future - TSH; Future - CBC; Future - Comprehensive metabolic panel with GFR; Future - Testosterone Total,Free,Bio, Males; Future  4. Screening for cardiovascular condition  - Lipid panel; Future - TSH; Future - CBC; Future - Comprehensive metabolic panel with GFR; Future  Darleene Shape, NP

## 2023-08-28 LAB — TESTOSTERONE TOTAL,FREE,BIO, MALES
Albumin: 4.8 g/dL (ref 3.6–5.1)
Sex Hormone Binding: 55 nmol/L — ABNORMAL HIGH (ref 10–50)
Testosterone, Bioavailable: 138.3 ng/dL (ref 110.0–575.0)
Testosterone, Free: 63.3 pg/mL (ref 46.0–224.0)
Testosterone: 709 ng/dL (ref 250–827)

## 2023-08-31 ENCOUNTER — Other Ambulatory Visit: Payer: Self-pay | Admitting: Adult Health

## 2023-08-31 DIAGNOSIS — R37 Sexual dysfunction, unspecified: Secondary | ICD-10-CM

## 2023-09-07 ENCOUNTER — Encounter: Admitting: Adult Health

## 2023-09-29 ENCOUNTER — Ambulatory Visit: Admitting: Urology

## 2023-12-08 ENCOUNTER — Ambulatory Visit: Admitting: Adult Health

## 2024-03-03 ENCOUNTER — Encounter: Payer: Self-pay | Admitting: Adult Health

## 2024-03-08 ENCOUNTER — Ambulatory Visit: Admitting: Adult Health

## 2024-03-09 ENCOUNTER — Ambulatory Visit: Admitting: Adult Health

## 2024-03-10 ENCOUNTER — Encounter: Payer: Self-pay | Admitting: Adult Health

## 2024-03-10 ENCOUNTER — Telehealth: Admitting: Adult Health

## 2024-03-10 VITALS — Ht 72.0 in | Wt 177.0 lb

## 2024-03-10 DIAGNOSIS — N529 Male erectile dysfunction, unspecified: Secondary | ICD-10-CM

## 2024-03-10 MED ORDER — TADALAFIL 10 MG PO TABS: 10.0000 mg | ORAL_TABLET | ORAL | 0 refills | Status: AC | PRN

## 2024-03-10 NOTE — Progress Notes (Signed)
 Virtual Visit via Video Note  I connected with Sergio Bray on 03/10/24 at  9:00 AM EST by a video enabled telemedicine application and verified that I am speaking with the correct person using two identifiers.  Location patient: home Location provider:work or home office Persons participating in the virtual visit: patient, provider  I discussed the limitations of evaluation and management by telemedicine and the availability of in person appointments. The patient expressed understanding and agreed to proceed.   HPI: Discussed the use of AI scribe software for clinical note transcription with the patient, who gave verbal consent to proceed.  History of Present Illness   Sergio Bray is a 39 year old male who presents with concerns about erectile dysfunction following discontinuation of Celexa .  He had erectile dysfunction during the last two to three months of taking Celexa , which he had used for about three to four years. He stopped Celexa  about 8  months ago and has had partial improvement since, estimating current function at 85-90% of baseline but still not fully recovered. The main concern is decreased erection firmness. He maintains regular exercise, a balanced diet, and does not use alcohol or tobacco. He has a supportive relationship with his wife, discusses these issues openly, and does not feel performance anxiety is an issues. SABRA He recalls prior testosterone  testing and is not aware of prostate problems.        ROS: See pertinent positives and negatives per HPI.  Past Medical History:  Diagnosis Date   Chicken pox    Generalized anxiety disorder     No past surgical history on file.  Family History  Problem Relation Age of Onset   Elevated Lipids Mother    Diabetes Paternal Grandfather    Heart attack Maternal Grandfather    Prostate cancer Father 40           Cancer Father       Current Medications[1]  EXAM:  VITALS per patient if  applicable:  GENERAL: alert, oriented, appears well and in no acute distress  HEENT: atraumatic, conjunttiva clear, no obvious abnormalities on inspection of external nose and ears  NECK: normal movements of the head and neck  LUNGS: on inspection no signs of respiratory distress, breathing rate appears normal, no obvious gross SOB, gasping or wheezing  CV: no obvious cyanosis  MS: moves all visible extremities without noticeable abnormality  PSYCH/NEURO: pleasant and cooperative, no obvious depression or anxiety, speech and thought processing grossly intact  ASSESSMENT AND PLAN:  Discussed the following assessment and plan:  Assessment and Plan    Erectile dysfunction Considered age-related changes. Testosterone  normal. No performance anxiety. Discussed PDE5 inhibitors, specifically Cialis , for longer action and as-needed use. Explained risks: headaches, hypotension.  - Prescribed Cialis  10 mg, start with half a tab as needed - Advised to check insurance coverage and consider [facility] for cost-effective option. - Instructed to monitor response and report issues.          I discussed the assessment and treatment plan with the patient. The patient was provided an opportunity to ask questions and all were answered. The patient agreed with the plan and demonstrated an understanding of the instructions.   The patient was advised to call back or seek an in-person evaluation if the symptoms worsen or if the condition fails to improve as anticipated.   Zakar Brosch, NP      [1]  Current Outpatient Medications:    LORazepam  (ATIVAN ) 0.5  MG tablet, Take 1 tablet (0.5 mg total) by mouth 2 (two) times daily as needed for anxiety., Disp: 15 tablet, Rfl: 0
